# Patient Record
Sex: Female | Born: 1975 | Race: White | Hispanic: No | Marital: Married | Smoking: Former smoker
Health system: Southern US, Community
[De-identification: ages and names within clinical notes are randomized; demographics above are authoritative.]

## PROBLEM LIST (undated history)

## (undated) DIAGNOSIS — Z789 Other specified health status: Secondary | ICD-10-CM

## (undated) HISTORY — PX: TONSILLECTOMY: SUR1361

## (undated) HISTORY — PX: WISDOM TOOTH EXTRACTION: SHX21

---

## 2002-06-20 ENCOUNTER — Other Ambulatory Visit: Admission: RE | Admit: 2002-06-20 | Discharge: 2002-06-20 | Payer: Self-pay | Admitting: Obstetrics and Gynecology

## 2003-06-30 ENCOUNTER — Other Ambulatory Visit: Admission: RE | Admit: 2003-06-30 | Discharge: 2003-06-30 | Payer: Self-pay | Admitting: Obstetrics and Gynecology

## 2004-07-01 ENCOUNTER — Other Ambulatory Visit: Admission: RE | Admit: 2004-07-01 | Discharge: 2004-07-01 | Payer: Self-pay | Admitting: Obstetrics and Gynecology

## 2004-07-02 ENCOUNTER — Other Ambulatory Visit: Admission: RE | Admit: 2004-07-02 | Discharge: 2004-07-02 | Payer: Self-pay | Admitting: Obstetrics and Gynecology

## 2005-07-04 ENCOUNTER — Other Ambulatory Visit: Admission: RE | Admit: 2005-07-04 | Discharge: 2005-07-04 | Payer: Self-pay | Admitting: Obstetrics and Gynecology

## 2008-10-03 ENCOUNTER — Encounter: Admission: RE | Admit: 2008-10-03 | Discharge: 2008-10-03 | Payer: Self-pay | Admitting: Chiropractic Medicine

## 2013-05-06 LAB — OB RESULTS CONSOLE RPR: RPR: NONREACTIVE

## 2013-05-06 LAB — OB RESULTS CONSOLE HIV ANTIBODY (ROUTINE TESTING): HIV: NONREACTIVE

## 2013-05-06 LAB — OB RESULTS CONSOLE RUBELLA ANTIBODY, IGM: RUBELLA: IMMUNE

## 2013-05-06 LAB — OB RESULTS CONSOLE ABO/RH: RH TYPE: POSITIVE

## 2013-05-06 LAB — OB RESULTS CONSOLE HEPATITIS B SURFACE ANTIGEN: HEP B S AG: POSITIVE

## 2013-05-06 LAB — OB RESULTS CONSOLE ANTIBODY SCREEN: ANTIBODY SCREEN: NEGATIVE

## 2013-05-30 NOTE — L&D Delivery Note (Signed)
Delivery Note At 3:14 AM a viable female was delivered via Vaginal, Spontaneous Delivery (Presentation: Left Occiput Anterior).  APGAR: 9, 9; weight pending.   Placenta status: Intact, Spontaneous.  Cord:  with the following complications: body cord x 1, and left compound hand.    Anesthesia:  None, 1% lidocaine for repair  Episiotomy: none Lacerations: 2nd degree Suture Repair: 3-0 vicryl Est. Blood Loss (mL): 250cc  Mom to postpartum.  Baby to Couplet care / Skin to Skin.  Tawnya CrookHogan, Heather Donovan 11/26/2013, 3:37 AM  Dr. Tenny Crawoss was in the room immediatly after the birth of the infant, and assumes care of the patient.   Pt quickly progressed from 2 to 10 cm in MAU. Baby delivered by the midwife. I assumed care and the placenta delivered spontaneously, intact with 3V. A second degree laceration was repaired with 3-0 vicryl after the perineum was infiltrated with lidocaine. Mother and baby are doing well after delivery

## 2013-11-08 LAB — OB RESULTS CONSOLE GBS: STREP GROUP B AG: NEGATIVE

## 2013-11-26 ENCOUNTER — Inpatient Hospital Stay (HOSPITAL_COMMUNITY): Payer: 59

## 2013-11-26 ENCOUNTER — Encounter (HOSPITAL_COMMUNITY): Payer: Self-pay | Admitting: *Deleted

## 2013-11-26 ENCOUNTER — Inpatient Hospital Stay (HOSPITAL_COMMUNITY)
Admission: AD | Admit: 2013-11-26 | Discharge: 2013-11-28 | DRG: 775 | Disposition: A | Discharge status: Home / Self Care | Payer: 59 | Source: Ambulatory Visit | Attending: Obstetrics and Gynecology | Admitting: Obstetrics and Gynecology

## 2013-11-26 DIAGNOSIS — Z3490 Encounter for supervision of normal pregnancy, unspecified, unspecified trimester: Secondary | ICD-10-CM

## 2013-11-26 DIAGNOSIS — Z87891 Personal history of nicotine dependence: Secondary | ICD-10-CM

## 2013-11-26 DIAGNOSIS — O09519 Supervision of elderly primigravida, unspecified trimester: Secondary | ICD-10-CM | POA: Diagnosis present

## 2013-11-26 DIAGNOSIS — O328XX0 Maternal care for other malpresentation of fetus, not applicable or unspecified: Secondary | ICD-10-CM | POA: Diagnosis present

## 2013-11-26 HISTORY — DX: Other specified health status: Z78.9

## 2013-11-26 LAB — CBC
HCT: 36.6 % (ref 36.0–46.0)
Hemoglobin: 12.8 g/dL (ref 12.0–15.0)
MCH: 31.1 pg (ref 26.0–34.0)
MCHC: 35 g/dL (ref 30.0–36.0)
MCV: 89.1 fL (ref 78.0–100.0)
PLATELETS: 141 10*3/uL — AB (ref 150–400)
RBC: 4.11 MIL/uL (ref 3.87–5.11)
RDW: 13.7 % (ref 11.5–15.5)
WBC: 13.5 10*3/uL — ABNORMAL HIGH (ref 4.0–10.5)

## 2013-11-26 LAB — RPR

## 2013-11-26 MED ORDER — TETANUS-DIPHTH-ACELL PERTUSSIS 5-2.5-18.5 LF-MCG/0.5 IM SUSP
0.5000 mL | Freq: Once | INTRAMUSCULAR | Status: AC
  Administered 2013-11-27: 0.5 mL via INTRAMUSCULAR
  Filled 2013-11-26: qty 0.5

## 2013-11-26 MED ORDER — ONDANSETRON HCL 4 MG/2ML IJ SOLN: 4.0000 mg | Freq: Four times a day (QID) | INTRAMUSCULAR | Status: DC | PRN

## 2013-11-26 MED ORDER — DIPHENHYDRAMINE HCL 25 MG PO CAPS: 25.0000 mg | ORAL_CAPSULE | Freq: Four times a day (QID) | ORAL | Status: DC | PRN

## 2013-11-26 MED ORDER — OXYCODONE-ACETAMINOPHEN 5-325 MG PO TABS: 1.0000 | ORAL_TABLET | ORAL | Status: DC | PRN

## 2013-11-26 MED ORDER — FLEET ENEMA 7-19 GM/118ML RE ENEM: 1.0000 | ENEMA | RECTAL | Status: DC | PRN

## 2013-11-26 MED ORDER — DIBUCAINE 1 % RE OINT
1.0000 "application " | TOPICAL_OINTMENT | RECTAL | Status: DC | PRN
  Administered 2013-11-28: 1 via RECTAL
  Filled 2013-11-26: qty 28

## 2013-11-26 MED ORDER — OXYTOCIN 40 UNITS IN LACTATED RINGERS INFUSION - SIMPLE MED: 62.5000 mL/h | INTRAVENOUS | Status: DC

## 2013-11-26 MED ORDER — OXYTOCIN 10 UNIT/ML IJ SOLN
INTRAMUSCULAR | Status: AC
  Administered 2013-11-26: 10 [IU]
  Filled 2013-11-26: qty 1

## 2013-11-26 MED ORDER — ONDANSETRON HCL 4 MG PO TABS: 4.0000 mg | ORAL_TABLET | ORAL | Status: DC | PRN

## 2013-11-26 MED ORDER — LIDOCAINE HCL (PF) 1 % IJ SOLN
INTRAMUSCULAR | Status: AC
  Administered 2013-11-26: 30 mL
  Filled 2013-11-26: qty 30

## 2013-11-26 MED ORDER — OXYTOCIN BOLUS FROM INFUSION: 500.0000 mL | INTRAVENOUS | Status: DC

## 2013-11-26 MED ORDER — SENNOSIDES-DOCUSATE SODIUM 8.6-50 MG PO TABS
2.0000 | ORAL_TABLET | ORAL | Status: DC
  Administered 2013-11-27 – 2013-11-28 (×2): 2 via ORAL
  Filled 2013-11-26 (×2): qty 2

## 2013-11-26 MED ORDER — SIMETHICONE 80 MG PO CHEW: 80.0000 mg | CHEWABLE_TABLET | ORAL | Status: DC | PRN

## 2013-11-26 MED ORDER — LIDOCAINE HCL (PF) 1 % IJ SOLN
30.0000 mL | INTRAMUSCULAR | Status: DC | PRN
  Filled 2013-11-26: qty 30

## 2013-11-26 MED ORDER — IBUPROFEN 600 MG PO TABS
600.0000 mg | ORAL_TABLET | Freq: Four times a day (QID) | ORAL | Status: DC
  Administered 2013-11-26 – 2013-11-28 (×10): 600 mg via ORAL
  Filled 2013-11-26 (×10): qty 1

## 2013-11-26 MED ORDER — LANOLIN HYDROUS EX OINT: TOPICAL_OINTMENT | CUTANEOUS | Status: DC | PRN

## 2013-11-26 MED ORDER — ACETAMINOPHEN 325 MG PO TABS: 650.0000 mg | ORAL_TABLET | ORAL | Status: DC | PRN

## 2013-11-26 MED ORDER — BENZOCAINE-MENTHOL 20-0.5 % EX AERO
1.0000 "application " | INHALATION_SPRAY | CUTANEOUS | Status: DC | PRN
  Administered 2013-11-26: 1 via TOPICAL
  Filled 2013-11-26: qty 56

## 2013-11-26 MED ORDER — WITCH HAZEL-GLYCERIN EX PADS
1.0000 "application " | MEDICATED_PAD | CUTANEOUS | Status: DC | PRN
  Administered 2013-11-28: 1 via TOPICAL

## 2013-11-26 MED ORDER — LACTATED RINGERS IV SOLN: INTRAVENOUS | Status: DC

## 2013-11-26 MED ORDER — ZOLPIDEM TARTRATE 5 MG PO TABS: 5.0000 mg | ORAL_TABLET | Freq: Every evening | ORAL | Status: DC | PRN

## 2013-11-26 MED ORDER — CITRIC ACID-SODIUM CITRATE 334-500 MG/5ML PO SOLN: 30.0000 mL | ORAL | Status: DC | PRN

## 2013-11-26 MED ORDER — ONDANSETRON HCL 4 MG/2ML IJ SOLN: 4.0000 mg | INTRAMUSCULAR | Status: DC | PRN

## 2013-11-26 MED ORDER — IBUPROFEN 600 MG PO TABS: 600.0000 mg | ORAL_TABLET | Freq: Four times a day (QID) | ORAL | Status: DC | PRN

## 2013-11-26 MED ORDER — PRENATAL MULTIVITAMIN CH
1.0000 | ORAL_TABLET | Freq: Every day | ORAL | Status: DC
  Administered 2013-11-26 – 2013-11-28 (×3): 1 via ORAL
  Filled 2013-11-26 (×3): qty 1

## 2013-11-26 MED ORDER — LACTATED RINGERS IV SOLN: 500.0000 mL | INTRAVENOUS | Status: DC | PRN

## 2013-11-26 NOTE — MAU Note (Signed)
PT SAYS  SHE HURT BAD SINCE 930PM.  VE IN OFFICE  TODAY  1 CM.     DENIES HSV AND MRSA.   GBS-  NEG.

## 2013-11-26 NOTE — H&P (Signed)
Cynthia Cross is a 38 y.o. female presenting for labor  38 yo G1P0 @ 38+6 presents c/o painful contractions. Patient was 2 cm dilated and then rapidly progressed to complete. Pregnancy complicated by AMA. Pt had a false positive HepBSAg History OB History   Grav Para Term Preterm Abortions TAB SAB Ect Mult Living   1              Past Medical History  Diagnosis Date  . Medical history non-contributory    Past Surgical History  Procedure Laterality Date  . Tonsillectomy    . Wisdom tooth extraction     Family History: family history is not on file. Social History:  reports that she quit smoking about 8 months ago. She does not have any smokeless tobacco history on file. She reports that she does not drink alcohol or use illicit drugs.   Prenatal Transfer Tool  Maternal Diabetes: No Genetic Screening: Normal Maternal Ultrasounds/Referrals: Normal Fetal Ultrasounds or other Referrals:  None Maternal Substance Abuse:  Yes:  Type: Smoker Significant Maternal Medications:  None Significant Maternal Lab Results:  Lab values include: HBsAG positive, false positive Other Comments:  None  ROS: as above  Dilation: 10 Effacement (%): 100 Station: +2;+3 Exam by:: H. Hogan CNM Blood pressure 132/74, pulse 96, temperature 97.8 F (36.6 C), temperature source Oral, resp. rate 20, height 5\' 3"  (1.6 m), weight 81.818 kg (180 lb 6 oz). Exam Physical Exam  Prenatal labs: ABO, Rh: A/Positive/-- (12/08 0000) Antibody: Negative (12/08 0000) Rubella: Immune (12/08 0000) RPR: Nonreactive (12/08 0000)  HBsAg: Positive (12/08 0000)  HIV: Non-reactive (12/08 0000)  GBS: Negative (06/12 0000)   Assessment/Plan: 1) Admit    ROSS,KENDRA H. 11/26/2013, 3:43 AM

## 2013-11-26 NOTE — Lactation Note (Signed)
This note was copied from the chart of Girl Isella Goens. Lactation Consultation Note  Patient Name: Girl Gonzella LexChristina Adachi WUJWJ'XToday's Date: 11/26/2013 Reason for consult: Initial assessment Per mom the baby has been spitty   Maternal Data Formula Feeding for Exclusion: No Infant to breast within first hour of birth: Yes (permom fed 30 mins on both breast ) Has patient been taught Hand Expression?: Yes Does the patient have breastfeeding experience prior to this delivery?: No  Feeding Feeding Type: Breast Fed Length of feed: 20 min (multiply swallows )  LATCH Score/Interventions Latch: Grasps breast easily, tongue down, lips flanged, rhythmical sucking. Intervention(s): Adjust position;Assist with latch;Breast massage;Breast compression  Audible Swallowing: Spontaneous and intermittent  Type of Nipple: Everted at rest and after stimulation  Comfort (Breast/Nipple): Soft / non-tender     Hold (Positioning): Assistance needed to correctly position infant at breast and maintain latch. Intervention(s): Breastfeeding basics reviewed;Support Pillows;Position options;Skin to skin  LATCH Score: 9  Lactation Tools Discussed/Used Tools:  (per mom has a DEBP Ameda at home ) [facility]   Consult Status Consult Status: Follow-up Date: 11/27/13 Follow-up type: In-patient    Kathrin Greathouseorio, Matheau Orona Ann 11/26/2013, 3:53 PM

## 2013-11-26 NOTE — MAU Provider Note (Signed)
S: Cynthia Cross is a 38 y.o. G1P0 at 8558w6d who present today with contractions, LOF and vaginal bleeding.  O: VSS, afebrile Breathing through contractions appears very uncomfortable External: no lesion, large amount of blood tinged watery mucous Vagina: large amount of mucousy blood. Unable to see any pooling of fluid  Cervix: pink, smooth, 2cm by RN exam  Uterus: AGA  FHT: 130, minimal to moderate variability at times. Early decels Toco: CTX q 3 mins  A/P: unable to determine ROM status Will continue to monitor FHT and contraction pattern

## 2013-11-27 LAB — CBC
HCT: 32.7 % — ABNORMAL LOW (ref 36.0–46.0)
Hemoglobin: 11.1 g/dL — ABNORMAL LOW (ref 12.0–15.0)
MCH: 30.7 pg (ref 26.0–34.0)
MCHC: 33.9 g/dL (ref 30.0–36.0)
MCV: 90.6 fL (ref 78.0–100.0)
PLATELETS: 121 10*3/uL — AB (ref 150–400)
RBC: 3.61 MIL/uL — ABNORMAL LOW (ref 3.87–5.11)
RDW: 14.1 % (ref 11.5–15.5)
WBC: 10.5 10*3/uL (ref 4.0–10.5)

## 2013-11-27 NOTE — Progress Notes (Signed)
PPD#1 Pt without complaints. Baby spitting a lot VSSAF IMP/ stable Plan/ Routine care.

## 2013-11-28 NOTE — Lactation Note (Signed)
This note was copied from the chart of Cynthia Pheonix Marrin. Lactation Consultation Note Discussed weight loss and feedings w/mom. Mom states baby is starting to cluster feed now. Had been sleepy and not eating well, making up for loss time now. Starting to pee and poop again. Keeping a good feeding log, encouraged to wake for feedings if no feeding cues noted. Patient Name: Cynthia Gonzella LexChristina Torbert RUEAV'WToday's Date: 11/28/2013     Maternal Data    Feeding Feeding Type: Breast Fed  LATCH Score/Interventions                      Lactation Tools Discussed/Used     Consult Status      Cynthia Cross, Cynthia Cross 11/28/2013, 6:24 AM

## 2013-11-28 NOTE — Discharge Summary (Signed)
Obstetric Discharge Summary Reason for Admission: onset of labor Prenatal Procedures: none Intrapartum Procedures: spontaneous vaginal delivery Postpartum Procedures: none Complications-Operative and Postpartum: 2 degree perineal laceration Hemoglobin  Date Value Ref Range Status  11/27/2013 11.1* 12.0 - 15.0 g/dL Final     HCT  Date Value Ref Range Status  11/27/2013 32.7* 36.0 - 46.0 % Final   Discharge Diagnoses: Term Pregnancy-delivered  Discharge Information: Date: 11/28/2013 Activity: pelvic rest Diet: routine Medications: None and Ibuprofen Condition: stable Instructions: refer to practice specific booklet Discharge to: home Follow-up Information   Follow up with Cynthia Cross,Cynthia H., Cynthia Cross In 4 weeks.   Specialty:  Obstetrics and Gynecology   Contact information:   [address] [place] [address] [phone]       Newborn Data: Live born female  Birth Weight: 6 lb (2722 g) APGAR: 9, 9  Home with mother.  Cynthia Cross A 11/28/2013, 7:45 AM

## 2013-11-28 NOTE — Progress Notes (Signed)
Patient is eating, ambulating, voiding.  Pain control is good.  Filed Vitals:   11/26/13 1800 11/27/13 0535 11/27/13 1745 11/28/13 0618  BP: 135/80 118/76 132/79 122/66  Pulse: 92 78 91 89  Temp: 98 F (36.7 C) 98.4 F (36.9 C) 98.2 F (36.8 C) 98.4 F (36.9 C)  TempSrc: Oral Oral Oral Oral  Resp: 18 20 18 19   Height:      Weight:      SpO2:    97%    Fundus firm Perineum without swelling.  Lab Results  Component Value Date   WBC 10.5 11/27/2013   HGB 11.1* 11/27/2013   HCT 32.7* 11/27/2013   MCV 90.6 11/27/2013   PLT 121* 11/27/2013    A/Positive/-- (12/08 0000)/RI  A/P Post partum day 1 1/2.  Routine care.  Expect d/c routine.    Madisun Hargrove A

## 2013-12-02 ENCOUNTER — Ambulatory Visit (HOSPITAL_COMMUNITY)
Admission: RE | Admit: 2013-12-02 | Discharge: 2013-12-02 | Disposition: A | Discharge status: Home / Self Care | Payer: 59 | Source: Ambulatory Visit | Attending: Obstetrics and Gynecology | Admitting: Obstetrics and Gynecology

## 2013-12-02 NOTE — Lactation Note (Addendum)
Lactation Consult  Mother's reason for visit:  Dr. Chestine Sporelark recommended  Visit Type:  Feeding assessment and weight check  Appointment Notes: none , apt made this am , add on   Consult:  Initial Lactation Consultant:  Kathrin GreathouseIorio, Luka Stohr Ann Baby's information  - unable to populate into this chart , delivery record note signed  ________________________________________________________________________  Birth weight - 6-0 oz  D/C weight - 5-8 oz  1st Dr. Visit - 5-5 on 7/3  2nd Dr. Visit - 5-11 oz 7/5  Per mom - was recommended at the 7/3 Dr. Visit to supplement due to 11% weight loss,  ______________________________________________________________________  Mother's Name: Gonzella Lexhristina Fringer Type of delivery:  Vaginal delivery  Breastfeeding Experience: 1st baby  Maternal Medical Conditions:  none  Maternal Medications:  Per mom PNV , Advil , Stoll softener ,   ________________________________________________________________________  Breastfeeding History (Post Discharge)- per mom milk came in Friday to Saturday , some engorgement   Frequency of breastfeeding:  Every 2-4 hours  Duration of feeding:  10-20 mins   Supplementation- per mom was advised by Dr. Eddie Candleummings on Friday 7/3 due to 11% weight loss per mom                                  1-2 oz after every feeding , per mom found baby was spitting a large portion back up . So                                   She changed to 1 oz after feedings , she did better.  Total in 24 hours 2 oz   Formula:  See above note        Brand: Similac  Breastmilk:  Non as of yet   Method:  Bottle, per mom Tommie Tippy nipple , per mom  does well   Pumping  Type of pump:  Ameda Frequency:  x1 ( has not fed to the baby yet )  Volume:  30 ml   Infant Intake and Output Assessment  Voids: 6-8  in 24 hrs.  Color:  Clear yellow Stools:  5-6  in 24 hrs.  Color:  Yellow( per mom was light brown and recently is more yellow    ________________________________________________________________________  Maternal Breast Assessment  Breast:  Full Nipple:  Erect Pain level:  0 Pain interventions:  Expressed breast milk Instructed on the use comfort gels , mom has small scabs on both nipples . ( per mom with the latch initially  sore but improves , encouraged breast compressions with latch and to obtain depth) . Per mom at consult better.   _______________________________________________________________________ Feeding Assessment/Evaluation  Initial feeding assessment: Baby awake , alert and rooting, color pink , no jaundice , and well hydrated.  Infant's oral assessment:  Variance- noted baby to have a high palate , great tongue mobility and range motion   Positioning:  Football Left breast  LATCH documentation:  Latch:  2 = Grasps breast easily, tongue down, lips flanged, rhythmical sucking.  Audible swallowing:  2 = Spontaneous and intermittent  Type of nipple:  2 = Everted at rest and after stimulation  Comfort (Breast/Nipple):  2 = Soft / non-tender  Hold (Positioning):  2 = No assistance needed to correctly position infant at breast  LATCH score:  9-10 , ( LC assisted with breast compressions  and depth , multiply  Swallows and gulps noted   Attached assessment:  Deep with assist   Lips flanged:  Yes.    Lips untucked:  No.  Suck assessment:  Nutritive  Tools:  Comfort gels ( obtained at [facility] visit )  Instructed on use and cleaning of tool:  Yes.    Pre-feed weight:  2632 g  (5 lb. 12.9 oz.) Post-feed weight:  2664 g (5 lb. 14 oz.) Amount transferred:  32 ml Amount supplemented:  None   Additional Feeding Assessment -   Infant's oral assessment:  Variance , high palate noted , great tongue mobility   Positioning:  Cross cradle Left breast  LATCH documentation:  Latch:  2 = Grasps breast easily, tongue down, lips flanged, rhythmical sucking.  Audible swallowing:  2 = Spontaneous and  intermittent  Type of nipple:  2 = Everted at rest and after stimulation  Comfort (Breast/Nipple):  1 = Filling, red/small blisters or bruises, mild/mod discomfort  Hold (Positioning):  2 = No assistance needed to correctly position infant at breast  LATCH score:  9   Attached assessment:  Deep  Lips flanged:  Yes.    Lips untucked:  Yes.    Suck assessment:  Nutritive  Tools:  Comfort gels Instructed on use and cleaning of tool:  Yes.    Pre-feed weight:  2664g  (5 lb. 14 oz.) Post-feed weight:  2670 g (5 lb. 14.2 oz.) Amount transferred:  6 ml Amount supplemented:  None   Still hungry - switched to the right breast , football , with depth ( LC encouraging mom to use the breast compressions  with latch until the baby is in a consistent swallowing pattern and then intermittent )  Pre-feed weight - 2670 g ( 5Ib.14.2 oz )  Post - feed weight - 2710 g (5-15.6 oz)  Amount transferred :  40 ml   Total amount pumped post feed:  R 40  ml    L 38 ml  Total amount transferred:  78  ml Total supplement given:  None   Lactation Plan of Care - LC recommended to call [facility] and arrange for weight check this Thursday or Friday                                          - Per mom F/U weight with [place] [facility] next Tues. Om 7/14                                          - LC recommended weight check with [facility] Thursday or Friday ( 16 or 17 th )                                          - BFSG , the following Monday or Tuesday at [facility]                                          - Praised mom for her efforts breast feeding                                          -  Feedings - Continue every 2 1/2 - 3 hours and with feeding cues , cluster feeding is normal with growth spurts ( 7-10 days, 3 weeks , 6 weeks , 3 months , 6 months )                                          - Steps for latching - breast massage , hand express off milk if to full , ( save ) , latch skin to skin with breast  compressions until [facility] is in a consistent pattern and then intermittent                                          - Rotating positions helps milk supply                                          - Always soften 1st breast , and then offer 2nd breast , if Kylee doesn't latch 2nd breast release down with pumping , save milk ( pumping 10 mins )                                          - Comfort gels after feedings for the next 6 days . And EBM

## 2014-03-06 ENCOUNTER — Other Ambulatory Visit: Payer: Self-pay | Admitting: Internal Medicine

## 2014-03-06 DIAGNOSIS — R7989 Other specified abnormal findings of blood chemistry: Secondary | ICD-10-CM

## 2014-03-06 DIAGNOSIS — R945 Abnormal results of liver function studies: Secondary | ICD-10-CM

## 2014-03-14 ENCOUNTER — Ambulatory Visit
Admission: RE | Admit: 2014-03-14 | Discharge: 2014-03-14 | Disposition: A | Discharge status: Home / Self Care | Payer: 59 | Source: Ambulatory Visit | Attending: Internal Medicine | Admitting: Internal Medicine

## 2014-03-14 DIAGNOSIS — R945 Abnormal results of liver function studies: Secondary | ICD-10-CM

## 2014-03-14 DIAGNOSIS — R7989 Other specified abnormal findings of blood chemistry: Secondary | ICD-10-CM

## 2014-03-31 ENCOUNTER — Encounter (HOSPITAL_COMMUNITY): Payer: Self-pay | Admitting: *Deleted

## 2016-03-15 ENCOUNTER — Other Ambulatory Visit: Payer: Self-pay | Admitting: Obstetrics and Gynecology

## 2016-03-15 DIAGNOSIS — R928 Other abnormal and inconclusive findings on diagnostic imaging of breast: Secondary | ICD-10-CM

## 2016-03-21 ENCOUNTER — Ambulatory Visit
Admission: RE | Admit: 2016-03-21 | Discharge: 2016-03-21 | Disposition: A | Discharge status: Home / Self Care | Payer: 59 | Source: Ambulatory Visit | Attending: Obstetrics and Gynecology | Admitting: Obstetrics and Gynecology

## 2016-03-21 DIAGNOSIS — R928 Other abnormal and inconclusive findings on diagnostic imaging of breast: Secondary | ICD-10-CM

## 2016-11-07 IMAGING — MG 2D DIGITAL DIAGNOSTIC UNILATERAL RIGHT MAMMOGRAM WITH CAD AND AD
4 series · 4 of 12 positions shown · non-contrast
Comparison: Previous exam(s).

CLINICAL DATA: Recall from screening mammography

EXAM:
2D DIGITAL DIAGNOSTIC UNILATERAL RIGHT MAMMOGRAM WITH CAD AND
ADJUNCT TOMO

[R MLO]
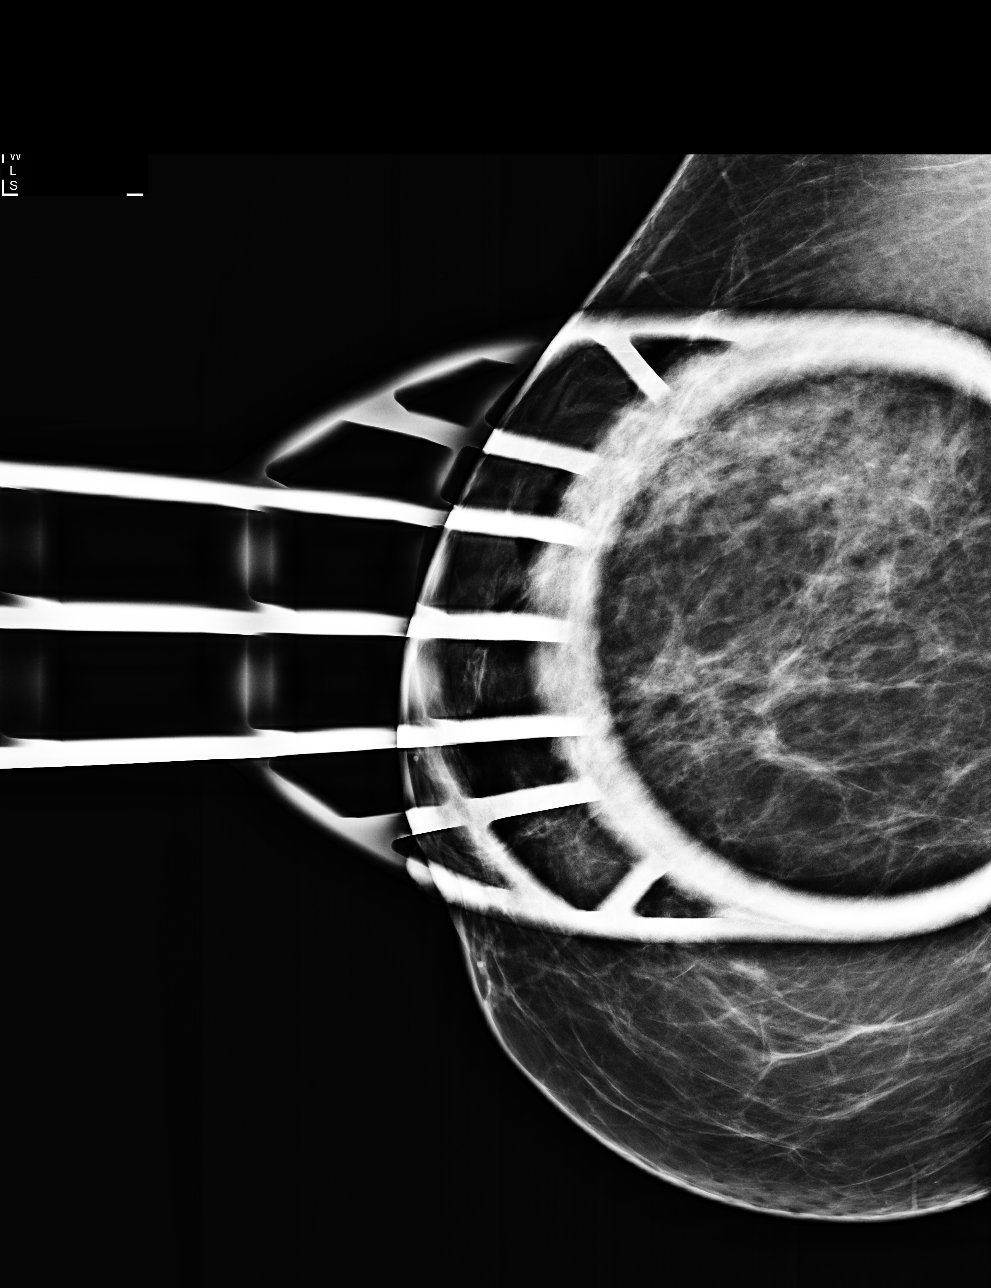

[R CC]
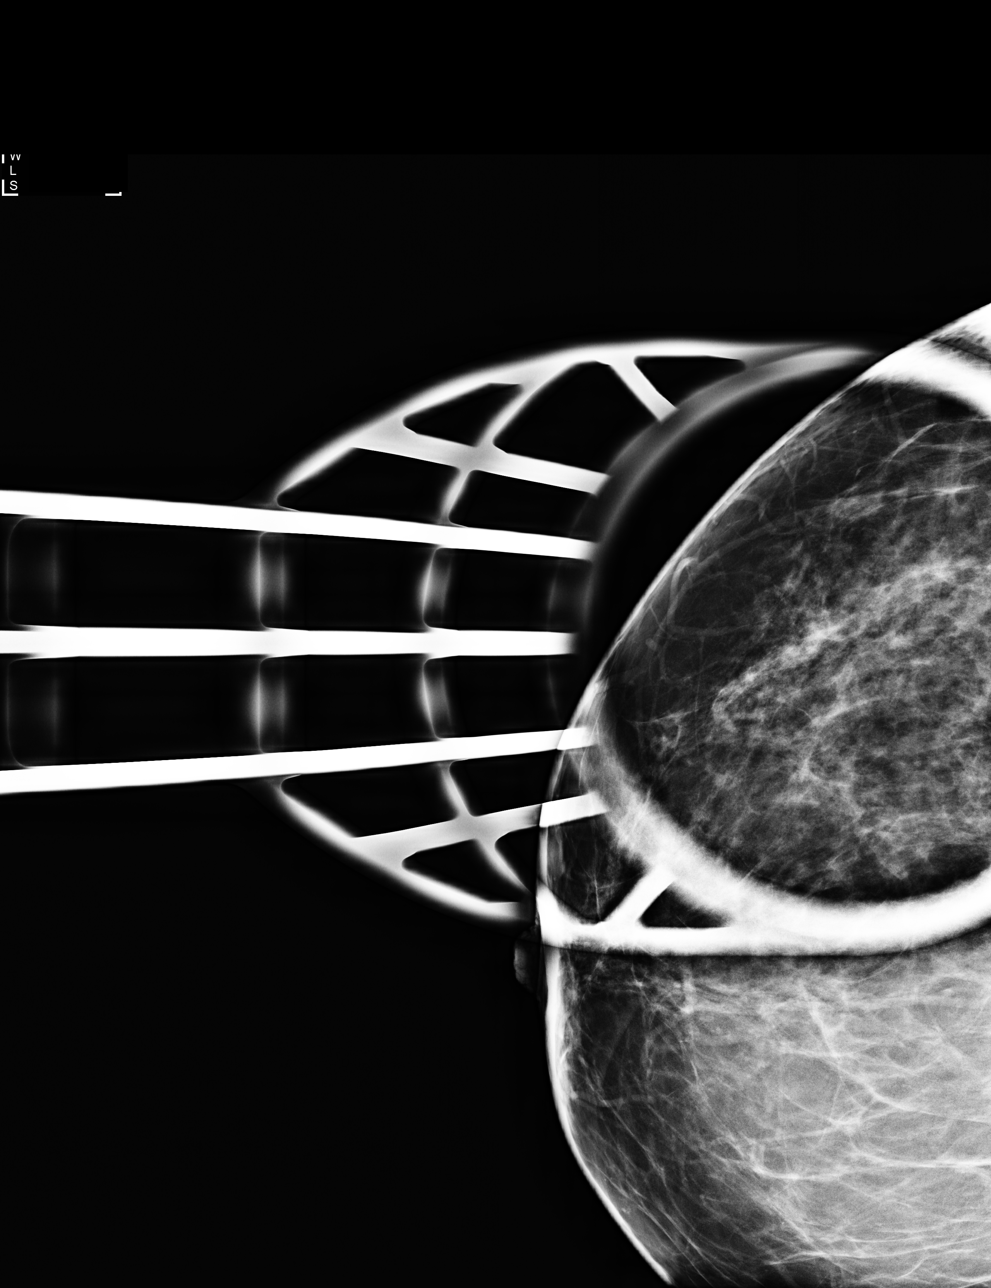

[R MLO tomo · tomo slice 39/78.0]
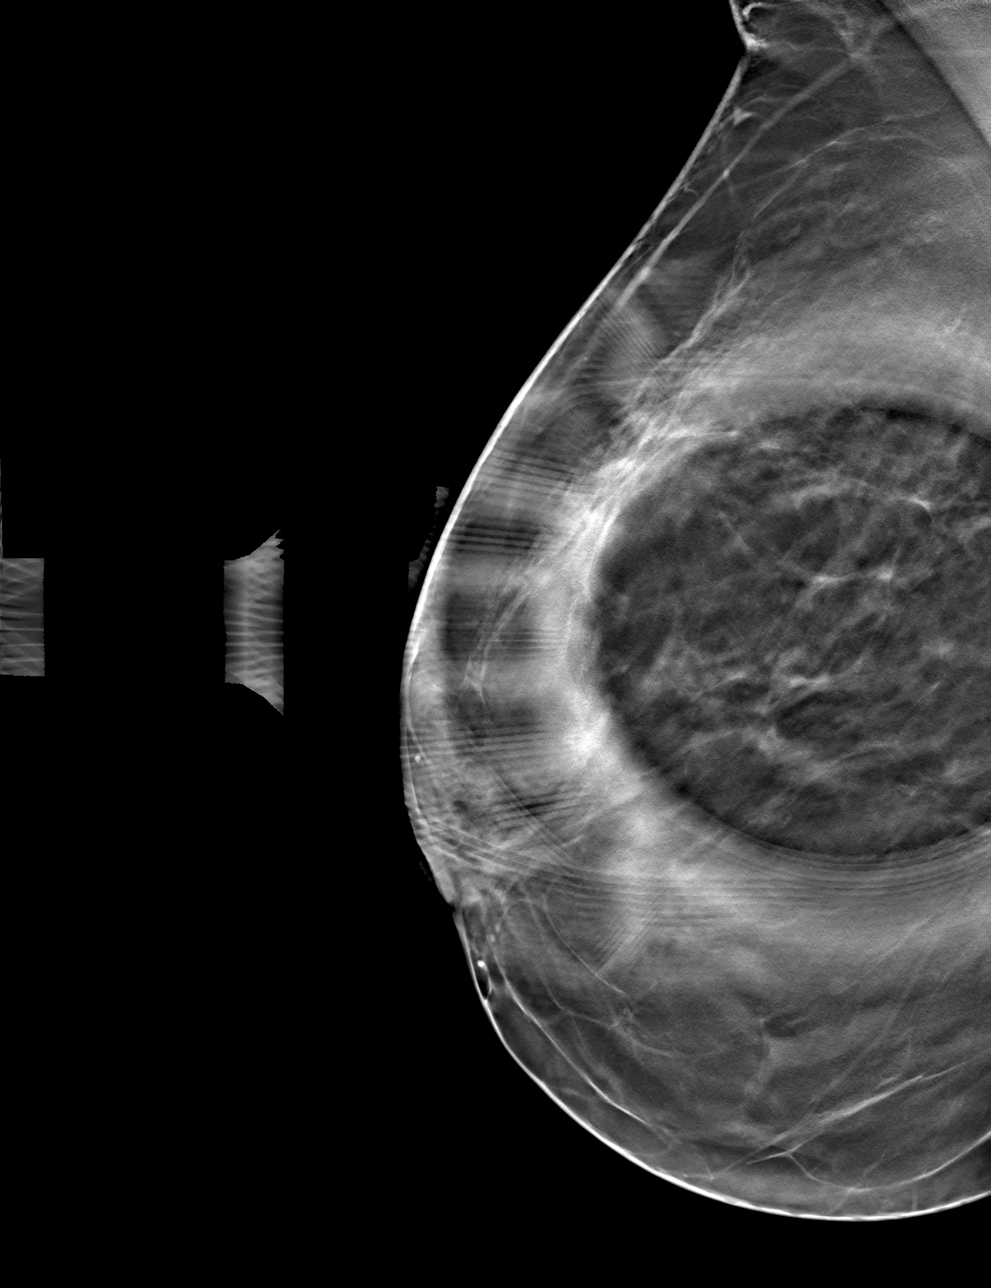

[R CC tomo · tomo slice 35/69.0]
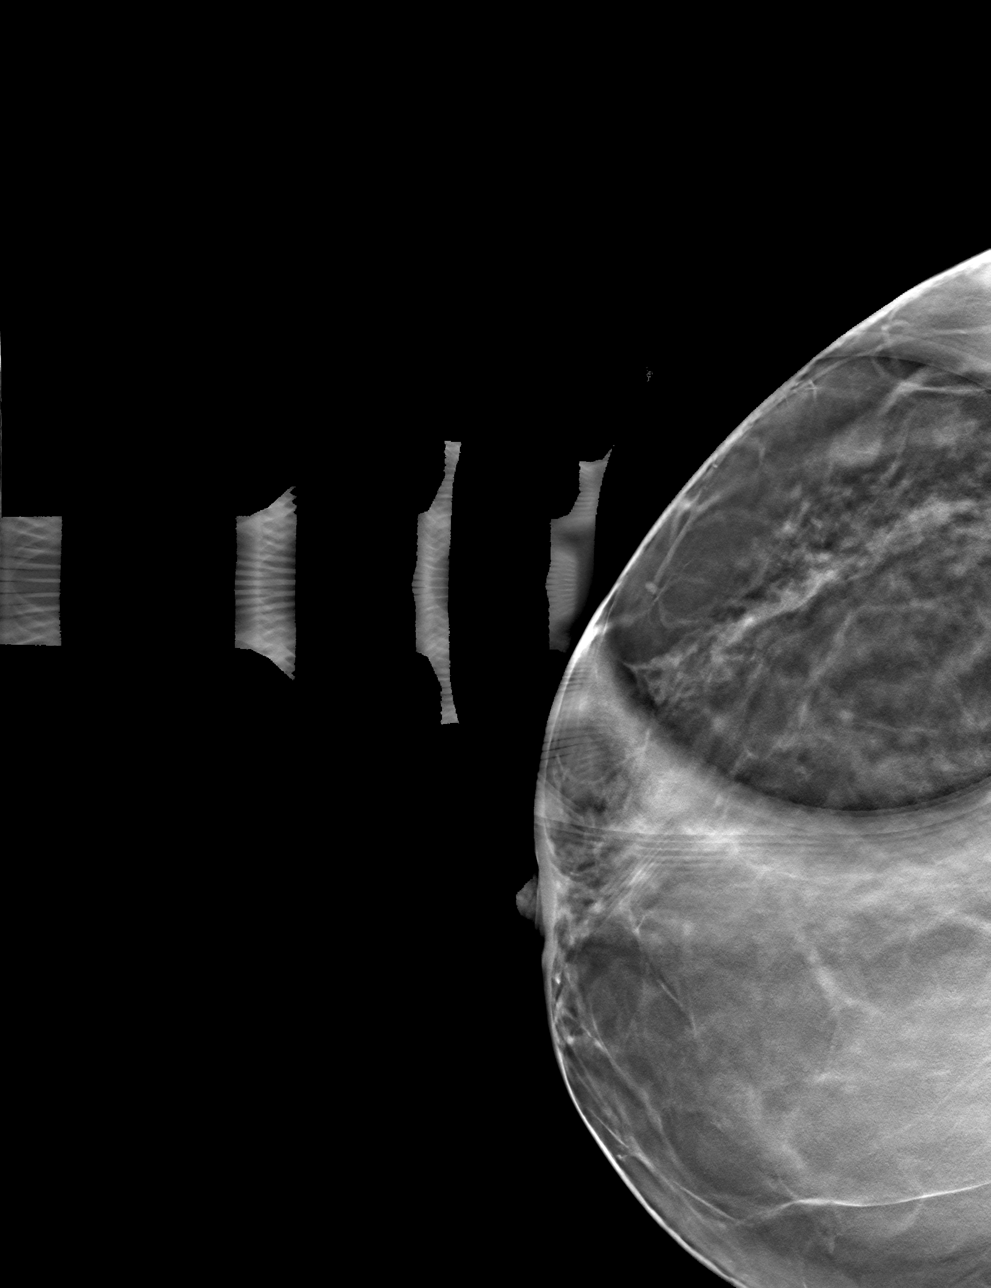

[4 of 12 positions shown; findings below may reference images not displayed]

ACR Breast Density Category b: There are scattered areas of
fibroglandular density.
FINDINGS: Additional views of the right breast demonstrate no persistent
abnormality. The appearance is consistent with a summation shadow.

Mammographic images were processed with CAD.
IMPRESSION: No persistent abnormality on additional evaluation of the right
breast.

RECOMMENDATION:
Screening mammography in 1 year.

I have discussed the findings and recommendations with the patient.
Results were also provided in writing at the conclusion of the
visit. If applicable, a reminder letter will be sent to the patient
regarding the next appointment.

BI-RADS CATEGORY  1: Negative.

## 2019-09-04 ENCOUNTER — Other Ambulatory Visit: Payer: Self-pay

## 2019-09-04 ENCOUNTER — Encounter: Payer: Self-pay | Admitting: Neurology

## 2019-09-04 ENCOUNTER — Ambulatory Visit: Payer: BC Managed Care – PPO | Admitting: Neurology

## 2019-09-04 VITALS — Temp 98.6°F | Ht 63.0 in | Wt 200.0 lb

## 2019-09-04 DIAGNOSIS — R002 Palpitations: Secondary | ICD-10-CM

## 2019-09-04 DIAGNOSIS — R42 Dizziness and giddiness: Secondary | ICD-10-CM | POA: Diagnosis not present

## 2019-09-04 DIAGNOSIS — E669 Obesity, unspecified: Secondary | ICD-10-CM

## 2019-09-04 DIAGNOSIS — G4719 Other hypersomnia: Secondary | ICD-10-CM | POA: Diagnosis not present

## 2019-09-04 DIAGNOSIS — H81399 Other peripheral vertigo, unspecified ear: Secondary | ICD-10-CM | POA: Diagnosis not present

## 2019-09-04 DIAGNOSIS — R351 Nocturia: Secondary | ICD-10-CM

## 2019-09-04 DIAGNOSIS — R0683 Snoring: Secondary | ICD-10-CM

## 2019-09-04 NOTE — Progress Notes (Signed)
Subjective:    Patient ID: Cynthia Cross is a 44 y.o. female.  HPI     Huston Foley, MD, PhD [facility] [address] [address] [place], [place] [address]  Dear Dr. Nicholos Johns,  I saw your patient, Cynthia Cross, upon your kind request in my neurologic clinic today for initial consultation of her lightheadedness and unsteadiness.  The patient is unaccompanied today.  As you know, Cynthia Cross is a 44 year old right-handed woman with an underlying benign medical history with the exception of obesity, who reports Intermittent fleeting spells of lightheadedness for the past 2 months.  These happens mostly in the mornings especially when she is in the shower but she has had a similar sensation even when sitting at the desk.  It helps to close her eyes briefly, sometimes it feels like she is swaying or there is a spinning sensation but typically it is not spinning and she has no associated headache, nausea or vomiting, no preceding illness.  She does not feel faint, she has not had any loss of consciousness or falls.  She tries to hydrate well with water.  She drinks 3 large bottles of water per day, and has reduced her caffeine intake.  She reports that she used to drink soda regularly but now drinks 1 every other day or so.  She denies any chest pain or shortness of breath but has had palpitations at night when laying still.  She has a history of snoring but denies any morning headaches.  She has nocturia about once per average night, goes to bed around 11 and rise time is around 630.  She quit smoking in 2014.  She has no family history of sleep apnea.  She has never had a sleep study.  Her Past Medical History Is Significant For: Past Medical History:  Diagnosis Date  . Medical history non-contributory     Her Past Surgical History Is Significant For: Past Surgical History:  Procedure Laterality Date  . TONSILLECTOMY    . WISDOM TOOTH EXTRACTION       Her Family History Is Significant For: History reviewed. No pertinent family history.  Her Social History Is Significant For: Social History   Socioeconomic History  . Marital status: Married    Spouse name: Not on file  . Number of children: Not on file  . Years of education: Not on file  . Highest education level: Not on file  Occupational History  . Not on file  Tobacco Use  . Smoking status: Former Smoker    Quit date: 02/26/2013    Years since quitting: 6.5  . Smokeless tobacco: Never Used  Substance and Sexual Activity  . Alcohol use: Yes    Comment: one time a yr.   . Drug use: No  . Sexual activity: Yes    Birth control/protection: None  Other Topics Concern  . Not on file  Social History Narrative  . Not on file   Social Determinants of Health   Financial Resource Strain:   . Difficulty of Paying Living Expenses:   Food Insecurity:   . Worried About Programme researcher, broadcasting/film/video in the Last Year:   . Barista in the Last Year:   Transportation Needs:   . Freight forwarder (Medical):   Marland Kitchen Lack of Transportation (Non-Medical):   Physical Activity:   . Days of Exercise per Week:   . Minutes of Exercise per Session:   Stress:   . Feeling  of Stress :   Social Connections:   . Frequency of Communication with Friends and Family:   . Frequency of Social Gatherings with Friends and Family:   . Attends Religious Services:   . Active Member of Clubs or Organizations:   . Attends Banker Meetings:   Marland Kitchen Marital Status:     Her Allergies Are:  No Known Allergies:   Her Current Medications Are:  Outpatient Encounter Medications as of 09/04/2019  Medication Sig  . [DISCONTINUED] Prenatal Vit-Fe Fumarate-FA (PRENATAL MULTIVITAMIN) TABS tablet Take 1 tablet by mouth daily at 12 noon.  . [DISCONTINUED] Progesterone Micronized (PROGESTERONE PO) Take by mouth.  . [DISCONTINUED] TRANEXAMIC ACID PO Take by mouth.   No facility-administered encounter  medications on file as of 09/04/2019.  :   Review of Systems:  Out of a complete 14 point review of systems, all are reviewed and negative with the exception of these symptoms as listed below:  Review of Systems  Neurological:       Pt presents today and she states that she has developed over the last 2 mths she began having light headed spells. They would last for brief periods and then it would pass. She has noticed in last 3 wks she has noticed that the light headed would linger longer. No imaging completed of the brain.     Objective:  Neurological Exam  Physical Exam Physical Examination:   Vitals:   09/04/19 1437  Temp: 98.6 F (37 C)   She reports slight lightheadedness when standing up but otherwise no vertigo symptoms, no nausea, only a fleeting sense of lightheadedness when she first stood up.  On orthostatic testing she has a supine blood pressure and pulse of 116/75 with a pulse of 87, sitting 123/82 with a pulse of 81, standing 131/85 with a pulse of 86.  General Examination: The patient is a very pleasant 44 y.o. female in no acute distress. She appears well-developed and adequately groomed.   HEENT: Normocephalic, atraumatic, pupils are equal, round and reactive to light and accommodation. Funduscopic exam is normal with sharp disc margins noted. Extraocular tracking is good without limitation to gaze excursion or nystagmus noted. Normal smooth pursuit is noted. Hearing is grossly intact. Tympanic membranes are clear bilaterally. Face is symmetric with normal facial animation and normal facial sensation. Speech is clear with no dysarthria noted. There is no hypophonia. There is no lip, neck/head, jaw or voice tremor. Neck is supple with full range of passive and active motion. There are no carotid bruits on auscultation. Oropharynx exam reveals: mild mouth dryness, adequate dental hygiene and mild airway crowding, due to smaller airway entry. Mallampati is class II. Tongue  protrudes centrally and palate elevates symmetrically. Tonsils are absent.   Chest: Clear to auscultation without wheezing, rhonchi or crackles noted.  Heart: S1+S2+0, regular and normal without murmurs, rubs or gallops noted.   Abdomen: Soft, non-tender and non-distended with normal bowel sounds appreciated on auscultation.  Extremities: There is no pitting edema in the distal lower extremities bilaterally.  Skin: Warm and dry without trophic changes noted.  Musculoskeletal: exam reveals no obvious joint deformities, tenderness or joint swelling or erythema.   Neurologically:  Mental status: The patient is awake, alert and oriented in all 4 spheres. Her immediate and remote memory, attention, language skills and fund of knowledge are appropriate. There is no evidence of aphasia, agnosia, apraxia or anomia. Speech is clear with normal prosody and enunciation. Thought process is linear. Mood is  normal and affect is normal.  Cranial nerves II - XII are as described above under HEENT exam. In addition: shoulder shrug is normal with equal shoulder height noted. Motor exam: Normal bulk, strength and tone is noted. There is no drift, tremor or rebound. Romberg is negative. Reflexes are 2+ throughout. Babinski: Toes are flexor bilaterally. Fine motor skills and coordination: intact with normal finger taps, normal hand movements, normal rapid alternating patting, normal foot taps and normal foot agility.  Cerebellar testing: No dysmetria or intention tremor on finger to nose testing. Heel to shin is unremarkable bilaterally. There is no truncal or gait ataxia.  Sensory exam: intact to light touch, vibration, temperature sense in the upper and lower extremities.  Gait, station and balance: She stands easily. No veering to one side is noted. No leaning to one side is noted. Posture is age-appropriate and stance is narrow based. Gait shows normal stride length and normal pace. No problems turning are noted.  Tandem walk is unremarkable.   Assessment and Plan:   In summary, Cynthia Cross is a very pleasant 44 y.o.-year old female with an underlying benign medical history with the exception of obesity, who presents for evaluation of intermittent lightheadedness with symptoms not telltale for vertigo, no obvious orthostatic hypotension or presyncopal symptoms, history also not suggestive of migraine etiology.  She may have some component of hypotension, neurological exam is nonfocal and reassuring.  She has some other symptoms including palpitations at night and history of snoring, given her obesity, she may be at risk for obstructive sleep apnea.  She does endorse some daytime tiredness.  She is advised to proceed with a sleep study.  We will also proceed with a brain MRI with and without contrast to rule out a structural cause of her symptoms from the neurological standpoint.  She may benefit from seeing a cardiologist what with her palpitations as well.  Nevertheless, she is encouraged to talk to you about it.  We will keep her posted as to her sleep study results and brain MRI results and take it from there.  We did talk about obstructive sleep apnea and the potential option of treating this with a CPAP or AutoPap machine.  I answered all her questions today and she was in agreement.  Thank you very much for allowing me to participate in the care of this nice patient. If I can be of any further assistance to you please do not hesitate to call me at [phone].  Sincerely,   Star Age, MD, PhD

## 2019-09-04 NOTE — Patient Instructions (Signed)
Your neurological exam is normal.  I do not have a good explanation for your episodic lightheadedness or dizziness.  Your history is not suggestive of migraines, vertigo, or syncope.  Please continue to hydrate well with water and change positions slowly.  You may benefit from seeing a cardiologist as you have had some palpitations before.  We will proceed with a brain MRI with and without contrast to rule out a structural cause of your symptoms and also a sleep study to rule out underlying obstructive sleep apnea as a potential cause of your dizziness.  We will call you to schedule these tests and also keep you updated by phone call and follow-up if needed.

## 2019-09-10 ENCOUNTER — Telehealth: Payer: Self-pay | Admitting: Neurology

## 2019-09-10 NOTE — Telephone Encounter (Signed)
spoke to the patient due to the cost she is going to think about it and get back to me  Cynthia Cross: [id number] (exp. 09/10/19 to 10/10/19)

## 2019-09-19 ENCOUNTER — Ambulatory Visit (INDEPENDENT_AMBULATORY_CARE_PROVIDER_SITE_OTHER): Payer: BC Managed Care – PPO | Admitting: Neurology

## 2019-09-19 ENCOUNTER — Other Ambulatory Visit: Payer: Self-pay

## 2019-09-19 DIAGNOSIS — R0683 Snoring: Secondary | ICD-10-CM

## 2019-09-19 DIAGNOSIS — G4719 Other hypersomnia: Secondary | ICD-10-CM

## 2019-09-19 DIAGNOSIS — G4710 Hypersomnia, unspecified: Secondary | ICD-10-CM

## 2019-09-19 DIAGNOSIS — R351 Nocturia: Secondary | ICD-10-CM

## 2019-09-19 DIAGNOSIS — E669 Obesity, unspecified: Secondary | ICD-10-CM

## 2019-09-19 DIAGNOSIS — G4720 Circadian rhythm sleep disorder, unspecified type: Secondary | ICD-10-CM

## 2019-09-19 DIAGNOSIS — H81399 Other peripheral vertigo, unspecified ear: Secondary | ICD-10-CM

## 2019-09-19 DIAGNOSIS — R002 Palpitations: Secondary | ICD-10-CM

## 2019-09-19 DIAGNOSIS — R42 Dizziness and giddiness: Secondary | ICD-10-CM

## 2019-09-27 NOTE — Progress Notes (Signed)
Patient referred by Dr. Nicholos Johns for lightheadedness, seen by me on 09/04/19 and she reported snoring, nighttime palpitations and nocturia. She had a diagnostic PSG on 09/19/19.   Please call and notify the patient that the recent sleep study did not show any significant obstructive sleep apnea with the exception of mild, intermittent snoring.  For her lightheadedness, we will proceed as planned with her brain MRI. Please ask her if she had been called to schedule yet.  Thanks,  Huston Foley, MD, PhD [facility])

## 2019-09-27 NOTE — Procedures (Signed)
PATIENT'S NAME:  Cynthia, Cross DOB:      June 23, 1975      MR#:    [id number]     DATE OF RECORDING: 09/19/2019 REFERRING M.D.:  Georgianne Fick MD Study Performed:   Baseline Polysomnogram HISTORY: 44 year old woman with a history of obesity, who reports intermittent fleeting spells of lightheadedness for the past 2 months. She has had palpitations at night when laying still. She has a history of snoring and reports nocturia. The patient's weight 200 pounds with a height of 63 (inches), resulting in a BMI of 35.5 kg/m2.   CURRENT MEDICATIONS: None listed   PROCEDURE:  This is a multichannel digital polysomnogram utilizing the Somnostar 11.2 system.  Electrodes and sensors were applied and monitored per AASM Specifications.   EEG, EOG, Chin and Limb EMG, were sampled at 200 Hz.  ECG, Snore and Nasal Pressure, Thermal Airflow, Respiratory Effort, CPAP Flow and Pressure, Oximetry was sampled at 50 Hz. Digital video and audio were recorded.      BASELINE STUDY  Lights Out was at 21:55 and Lights On at 04:32.  Total recording time (TRT) was 397 minutes, with a total sleep time (TST) of 304 minutes.   The patient's sleep latency was 57.5 minutes, which is delayed. REM latency was 102.5 minutes, which is normal.  The sleep efficiency was 76.6 %.     SLEEP ARCHITECTURE: WASO (Wake after sleep onset) was 45.5 minutes with mild sleep fragmentation noted. There were 6 minutes in Stage N1, 195.5 minutes Stage N2, 67.5 minutes Stage N3 and 35 minutes in Stage REM.  The percentage of Stage N1 was 2.%, Stage N2 was 64.3%, which is increased, Stage N3 was 22.2% and Stage R (REM sleep) was 11.5%, which is reduced. The arousals were noted as: 39 were spontaneous, 0 were associated with PLMs, 2 were associated with respiratory events.  RESPIRATORY ANALYSIS:  There were a total of 4 respiratory events:  0 obstructive apneas, 0 central apneas and 2 mixed apneas with a total of 2 apneas and an apnea index (AI) of  .4 /hour. There were 2 hypopneas with a hypopnea index of .4 /hour. The patient also had 0 respiratory event related arousals (RERAs).      The total APNEA/HYPOPNEA INDEX (AHI) was .8/hour and the total RESPIRATORY DISTURBANCE INDEX was  .8 /hour.  2 events occurred in REM sleep and 3 events in NREM. The REM AHI was  3.4 /hour, versus a non-REM AHI of .4. The patient spent 114.5 minutes of total sleep time in the supine position and 190 minutes in non-supine.. The supine AHI was 2.0 versus a non-supine AHI of 0.0.  OXYGEN SATURATION & C02:  The Wake baseline 02 saturation was 96%, with the lowest being 88%. Time spent below 89% saturation equaled 0 minutes.  PERIODIC LIMB MOVEMENTS: The patient had a total of 0 Periodic Limb Movements.  The Periodic Limb Movement (PLM) index was 0 and the PLM Arousal index was 0/hour.  Audio and video analysis did not show any abnormal or unusual movements, behaviors, phonations or vocalizations. The patient took 1 bathroom break. Mild intermittent snoring was noted. The EKG was in keeping with normal sinus rhythm (NSR).  Post-study, the patient indicated that sleep was worse than usual.   IMPRESSION:  1. Primary Snoring 2. Dysfunctions associated with sleep stages or arousal from sleep  RECOMMENDATIONS:  1. This study does not demonstrate any significant obstructive or central sleep disordered breathing with the exception of mild, intermittent snoring.  This study does not support an intrinsic sleep disorder as a cause of the patient's symptoms. Other causes, including circadian rhythm disturbances, an underlying mood disorder, medication effect and/or an underlying medical problem cannot be ruled out. 2. This study shows sleep fragmentation and abnormal sleep stage percentages; these are nonspecific findings and per se do not signify an intrinsic sleep disorder or a cause for the patient's sleep-related symptoms. Causes include (but are not limited to) the first  night effect of the sleep study, circadian rhythm disturbances, medication effect or an underlying mood disorder or medical problem.  3. The patient should be cautioned not to drive, work at heights, or operate dangerous or heavy equipment when tired or sleepy. Review and reiteration of good sleep hygiene measures should be pursued with any patient. 4. The patient her primary care physician will be notified of the test results.  I certify that I have reviewed the entire raw data recording prior to the issuance of this report in accordance with the Standards of Accreditation of the American Academy of Sleep Medicine (AASM)    Star Age, MD, PhD Diplomat, American Board of Neurology and Sleep Medicine (Neurology and Sleep Medicine)

## 2019-09-30 ENCOUNTER — Telehealth: Payer: Self-pay

## 2019-09-30 NOTE — Telephone Encounter (Signed)
I called pt. I advised pt that Dr. Frances Furbish reviewed pt's sleep study and found no osa in the recent sleep study. Dr. Frances Furbish recommends that pt proceed with the MRI of the brain. Pt has not been scheduled as of yet. Note from Gerome Apley on 09/10/2019 sts she called the pt to discuss scheduling but pt requested to hold off at that point due to finances.  I dicussed this with the pt again today and she agreed to pursing the MRI message sent to [facility] notifying her of this information.

## 2019-09-30 NOTE — Telephone Encounter (Signed)
-----   Message from Huston Foley, MD sent at 09/27/2019 12:40 PM EDT ----- Patient referred by Dr. Nicholos Johns for lightheadedness, seen by me on 09/04/19 and she reported snoring, nighttime palpitations and nocturia. She had a diagnostic PSG on 09/19/19.   Please call and notify the patient that the recent sleep study did not show any significant obstructive sleep apnea with the exception of mild, intermittent snoring.  For her lightheadedness, we will proceed as planned with her brain MRI. Please ask her if she had been called to schedule yet.  Thanks,  Huston Foley, MD, PhD [facility])

## 2019-09-30 NOTE — Telephone Encounter (Signed)
I spoke with the pt today in regards to her sleep study. Pt is agreeable to pursuing the MRI at this time.

## 2019-10-01 NOTE — Telephone Encounter (Signed)
Spoke to the patient she is scheduled at [facility] for 10/09/19. [employer] AUth: [id number] (exp. 09/10/19 to 11/01/19)

## 2019-10-09 ENCOUNTER — Ambulatory Visit: Payer: BC Managed Care – PPO

## 2019-10-09 ENCOUNTER — Other Ambulatory Visit: Payer: Self-pay

## 2019-10-09 DIAGNOSIS — R351 Nocturia: Secondary | ICD-10-CM

## 2019-10-09 DIAGNOSIS — G4719 Other hypersomnia: Secondary | ICD-10-CM

## 2019-10-09 DIAGNOSIS — E669 Obesity, unspecified: Secondary | ICD-10-CM

## 2019-10-09 DIAGNOSIS — R0683 Snoring: Secondary | ICD-10-CM

## 2019-10-09 DIAGNOSIS — R42 Dizziness and giddiness: Secondary | ICD-10-CM

## 2019-10-09 DIAGNOSIS — H81399 Other peripheral vertigo, unspecified ear: Secondary | ICD-10-CM

## 2019-10-09 DIAGNOSIS — R002 Palpitations: Secondary | ICD-10-CM

## 2019-10-09 MED ORDER — GADOBENATE DIMEGLUMINE 529 MG/ML IV SOLN
20.0000 mL | Freq: Once | INTRAVENOUS | Status: AC | PRN
  Administered 2019-10-09: 20 mL via INTRAVENOUS

## 2019-10-11 NOTE — Progress Notes (Signed)
Please call and advise the patient that the recent scan we did was within normal limits. We did a brain MRI w and wo contrast, which showed normal findings. In particular, there were no acute findings, such as a stroke, or mass or blood products. No further action is required on this test at this time. At this juncture, she can FU with PCP.

## 2019-10-15 ENCOUNTER — Telehealth: Payer: Self-pay

## 2019-10-15 NOTE — Telephone Encounter (Signed)
Pt notified of results and verbalized understanding. She was agreeable to following up with PCP.

## 2019-10-15 NOTE — Telephone Encounter (Signed)
-----   Message from Huston Foley, MD sent at 10/11/2019 10:01 AM EDT ----- Please call and advise the patient that the recent scan we did was within normal limits. We did a brain MRI w and wo contrast, which showed normal findings. In particular, there were no acute findings, such as a stroke, or mass or blood products. No further action is required on this test at this time. At this juncture, she can FU with PCP.

## 2019-11-27 ENCOUNTER — Ambulatory Visit: Payer: BC Managed Care – PPO | Admitting: Cardiovascular Disease

## 2019-11-27 ENCOUNTER — Encounter: Payer: Self-pay | Admitting: Cardiovascular Disease

## 2019-11-27 ENCOUNTER — Encounter: Payer: Self-pay | Admitting: *Deleted

## 2019-11-27 ENCOUNTER — Other Ambulatory Visit: Payer: Self-pay

## 2019-11-27 VITALS — BP 122/82 | HR 79 | Ht 64.0 in | Wt 197.0 lb

## 2019-11-27 DIAGNOSIS — R002 Palpitations: Secondary | ICD-10-CM | POA: Diagnosis not present

## 2019-11-27 DIAGNOSIS — R55 Syncope and collapse: Secondary | ICD-10-CM | POA: Diagnosis not present

## 2019-11-27 DIAGNOSIS — R42 Dizziness and giddiness: Secondary | ICD-10-CM | POA: Diagnosis not present

## 2019-11-27 HISTORY — DX: Dizziness and giddiness: R42

## 2019-11-27 HISTORY — DX: Palpitations: R00.2

## 2019-11-27 NOTE — Progress Notes (Signed)
Cardiology Office Note   Date:  11/27/2019   ID:  Cynthia Cross, DOB Jan 09, 1976, MRN [id number]  PCP:  Georgianne Fick, MD  Cardiologist:   Chilton Si, MD   No chief complaint on file.  History of Present Illness: Cynthia Cross is a 44 y.o. female who presents for an evaluation of dizziness.  Symptoms started a couple months ago.  There were a few episodes that lasted all day.  It occurs sometimes when she is sitting at her desk and seems to improve when closing her eyes.  She was tested for vertigo and symptoms were not thought to be consistent with this.  The episodes seem to happen a lot in the morning, especially when she is in the bathroom looking up and putting on make-up.  It is also happened in the shower.  It seems to occur sporadically.  There is no association with stress or her diet.  She denies any syncope.  She also denies any chest pain, lower extremity edema, or shortness of breath.  She has also experienced palpitations for years.  She notices them most when laying in bed at night.  It lasts around 10 seconds and improves with taking a deep breath.  The episodes occur randomly.  She was at the beach a few weeks ago and it occurred every night.  Other times it occurs once every week or 2.  She thinks it may sometimes be associated with stress.  When it happens it makes her feel like she needs to take a deep breath.  She occasionally has episodes of chest pressure that are unrelated and do not occur with exertion.  She had a sleep study that was negative for obstructive sleep apnea.  She is limited caffeine to 1 soda occasionally and this has not affected her symptoms.  Past Medical History:  Diagnosis Date  . Lightheadedness 11/27/2019  . Medical history non-contributory   . Palpitations 11/27/2019    Past Surgical History:  Procedure Laterality Date  . TONSILLECTOMY    . WISDOM TOOTH EXTRACTION       No current outpatient medications on file.   No  current facility-administered medications for this visit.    Allergies:   Patient has no known allergies.    Social History:  The patient  reports that she quit smoking about 6 years ago. She has never used smokeless tobacco. She reports current alcohol use. She reports that she does not use drugs.   Family History:  The patient's family history includes Aneurysm in her father; Heart attack (age of onset: 72) in her paternal grandmother; Hypertension in her paternal uncle; Kidney Stones in her mother.    ROS:  Please see the history of present illness.   Otherwise, review of systems are positive for none.   All other systems are reviewed and negative.    PHYSICAL EXAM: VS:  BP 122/82   Pulse 79   Ht 5\' 4"  (1.626 m)   Wt 197 lb (89.4 kg)   SpO2 98%   BMI 33.81 kg/m  , BMI Body mass index is 33.81 kg/m. GENERAL:  Well appearing HEENT:  Pupils equal round and reactive, fundi not visualized, oral mucosa unremarkable NECK:  No jugular venous distention, waveform within normal limits, carotid upstroke brisk and symmetric, no bruits LUNGS:  Clear to auscultation bilaterally HEART:  RRR.  PMI not displaced or sustained,S1 and S2 within normal limits, no S3, no S4, no clicks, no rubs, no murmurs ABD:  Flat,  positive bowel sounds normal in frequency in pitch, no bruits, no rebound, no guarding, no midline pulsatile mass, no hepatomegaly, no splenomegaly EXT:  2 plus pulses throughout, no edema, no cyanosis no clubbing SKIN:  No rashes no nodules NEURO:  Cranial nerves II through XII grossly intact, motor grossly intact throughout PSYCH:  Cognitively intact, oriented to person place and time   EKG:  EKG is ordered today. The ekg ordered today demonstrates sinus rhythm.  Rate 79 bpm.   Recent Labs: No results found for requested labs within last 8760 hours.    Lipid Panel No results found for: CHOL, TRIG, HDL, CHOLHDL, VLDL, LDLCALC, LDLDIRECT    Wt Readings from Last 3 Encounters:   11/27/19 197 lb (89.4 kg)  09/04/19 200 lb (90.7 kg)  11/26/13 180 lb 6 oz (81.8 kg)      ASSESSMENT AND PLAN:  # Lightheadedness: Symptoms do not seem to be consistent with a cardiovascular source.  The fact that it occurs when sitting is not consistent with orthostasis.  She also has been negative for orthostatic vital signs when previously checked.  I do wonder if she is having some vasovagal symptoms in the bathroom while in the shower.  Encouraged increasing her fluid intake.  Her blood pressure has been stable.  We will get an echo to make sure there is no evidence of any structural heart disease.  Have asked her to check her blood pressure at the time of 1 of these events to see if it is low.  # Palpitations: Episodes are sporadic and mostly when laying in bed.  I suspect she is having PACs or PVCs.  She had the appropriate lab testing with her PCP including thyroid.  These have been unremarkable.  We will get a 30 day monitor to better assess.  She is limiting caffeine intake.   Current medicines are reviewed at length with the patient today.  The patient does not have concerns regarding medicines.  The following changes have been made:  no change  Labs/ tests ordered today include:   Orders Placed This Encounter  Procedures  . CARDIAC EVENT MONITOR  . EKG 12-Lead  . ECHOCARDIOGRAM COMPLETE     Disposition:   FU with Flo Berroa C. Duke Salvia, MD, [facility] in 2 months     Signed, Ziquan Fidel C. Duke Salvia, MD, [facility]  11/27/2019 10:49 AM    [facility]

## 2019-11-27 NOTE — Progress Notes (Signed)
Patient ID: Cynthia Cross, female   DOB: [birth year], 43 y.o.   MRN: [id number] Patient enrolled for [employer] to ship a 30 day cardiac event monitor to her home.

## 2019-11-27 NOTE — Patient Instructions (Signed)
Medication Instructions:  Your physician recommends that you continue on your current medications as directed. Please refer to the Current Medication list given to you today.  *If you need a refill on your cardiac medications before your next appointment, please call your pharmacy*  Lab Work: NONE   Testing/Procedures: Your physician has requested that you have an echocardiogram. Echocardiography is a painless test that uses sound waves to create images of your heart. It provides your doctor with information about the size and shape of your heart and how well your heart's chambers and valves are working. This procedure takes approximately one hour. There are no restrictions for this procedure. [facility] AT [address]   Your physician has recommended that you wear an event monitor. Event monitors are medical devices that record the heart's electrical activity. Doctors most often [place] these monitors to diagnose arrhythmias. Arrhythmias are problems with the speed or rhythm of the heartbeat. The monitor is a small, portable device. You can wear one while you do your normal daily activities. This is usually used to diagnose what is causing palpitations/syncope (passing out). 30 DAY   Follow-Up: At [facility], you and your health needs are our priority.  As part of our continuing mission to provide you with exceptional heart care, we have created designated Provider Care Teams.  These Care Teams include your primary Cardiologist (physician) and Advanced Practice Providers (APPs -  Physician Assistants and Nurse Practitioners) who all work together to provide you with the care you need, when you need it.  We recommend signing up for the patient portal called "MyChart".  Sign up information is provided on this After Visit Summary.  MyChart is used to connect with patients for Virtual Visits (Telemedicine).  Patients are able to view lab/test results, encounter notes, upcoming  appointments, etc.  Non-urgent messages can be sent to your provider as well.   To learn more about what you can do with MyChart, go to [email or web address].    Your next appointment:   2 month(s)  The format for your next appointment:   In Person  Provider:   You may see DR [facility]  or one of the following Advanced Practice Providers on your designated Care Team:    Corine Shelter, PA-C  [place], [place]  Edd Fabian, [place]    Other Instructions  [employer] Cardiac Event Monitor Instructions Your physician has requested you wear your cardiac event monitor for __30__ days, (1-30). [employer] may call or text to confirm a shipping address. The monitor will be sent to a land address via UPS. [employer] will not ship a monitor to a PO BOX. It typically takes 3-5 days to receive your monitor after it has been enrolled. [employer] will assist with USPS tracking if your package is delayed. The telephone number for [employer] is [phone]. Once you have received your monitor, please review the enclosed instructions. Instruction tutorials can also be viewed under help and settings on the enclosed cell phone. Your monitor has already been registered assigning a specific monitor serial # to you.  Applying the monitor Remove cell phone from case and turn it on. The cell phone works as IT consultant and needs to be within [employer] of you at all times. The cell phone will need to be charged on a daily basis. We recommend you plug the cell phone into the enclosed charger at your bedside table every night.  Monitor batteries: You will receive two monitor batteries labelled #1  and #2. These are your recorders. Plug battery #2 onto the second connection on the enclosed charger. Keep one battery on the charger at all times. This will keep the monitor battery deactivated. It will also keep it fully charged for when you need to switch your monitor batteries. A small light will be  blinking on the battery emblem when it is charging. The light on the battery emblem will remain on when the battery is fully charged.  Open package of a Monitor strip. Insert battery #1 into black hood on strip and gently squeeze monitor battery onto connection as indicated in instruction booklet. Set aside while preparing skin.  Choose location for your strip, vertical or horizontal, as indicated in the instruction booklet. Shave to remove all hair from location. There cannot be any lotions, oils, powders, or colognes on skin where monitor is to be applied. Wipe skin clean with enclosed Saline wipe. Dry skin completely.  Peel paper labeled #1 off the back of the Monitor strip exposing the adhesive. Place the monitor on the chest in the vertical or horizontal position shown in the instruction booklet. One arrow on the monitor strip must be pointing upward. Carefully remove paper labeled #2, attaching remainder of strip to your skin. Try not to create any folds or wrinkles in the strip as you apply it.  Firmly press and release the circle in the center of the monitor battery. You will hear a small beep. This is turning the monitor battery on. The heart emblem on the monitor battery will light up every 5 seconds if the monitor battery in turned on and connected to the patient securely. Do not push and hold the circle down as this turns the monitor battery off. The cell phone will locate the monitor battery. A screen will appear on the cell phone checking the connection of your monitor strip. This may read poor connection initially but change to good connection within the next minute. Once your monitor accepts the connection you will hear a series of 3 beeps followed by a climbing crescendo of beeps. A screen will appear on the cell phone showing the two monitor strip placement options. Touch the picture that demonstrates where you applied the monitor strip.  Your monitor strip and battery are  waterproof. You are able to shower, bathe, or swim with the monitor on. They just ask you do not submerge deeper than 3 feet underwater. We recommend removing the monitor if you are swimming in a lake, river, or ocean.  Your monitor battery will need to be switched to a fully charged monitor battery approximately once a week. The cell phone will alert you of an action which needs to be made.  On the cell phone, tap for details to reveal connection status, monitor battery status, and cell phone battery status. The green dots indicates your monitor is in good status. A red dot indicates there is something that needs your attention.  To record a symptom, click the circle on the monitor battery. In 30-60 seconds a list of symptoms will appear on the cell phone. Select your symptom and tap save. Your monitor will record a sustained or significant arrhythmia regardless of you clicking the button. Some patients do not feel the heart rhythm irregularities. [employer] will notify us of any serious or critical events.  Refer to instruction booklet for instructions on switching batteries, changing strips, the Do not disturb or Pause features, or any additional questions.  Call [employer] at [phone], to confirm your  monitor is transmitting and record your baseline. They will answer any questions you may have regarding the monitor instructions at that time.  Returning the monitor to [employer] Place all equipment back into blue box. Peel off strip of paper to expose adhesive and close box securely. There is a prepaid UPS shipping label on this box. Drop in a UPS drop box, or at a UPS facility like [employer]. You may also contact [employer] to arrange UPS to pick up monitor package at your home.   Echocardiogram An echocardiogram is a procedure that uses painless sound waves (ultrasound) to produce an image of the heart. Images from an echocardiogram can provide important information  about:  Signs of coronary artery disease (CAD).  Aneurysm detection. An aneurysm is a weak or damaged part of an artery wall that bulges out from the normal force of blood pumping through the body.  Heart size and shape. Changes in the size or shape of the heart can be associated with certain conditions, including heart failure, aneurysm, and CAD.  Heart muscle function.  Heart valve function.  Signs of a past heart attack.  Fluid buildup around the heart.  Thickening of the heart muscle.  A tumor or infectious growth around the heart valves. Tell a health care provider about:  Any allergies you have.  All medicines you are taking, including vitamins, herbs, eye drops, creams, and over-the-counter medicines.  Any blood disorders you have.  Any surgeries you have had.  Any medical conditions you have.  Whether you are pregnant or may be pregnant. What are the risks? Generally, this is a safe procedure. However, problems may occur, including:  Allergic reaction to dye (contrast) that may be used during the procedure. What happens before the procedure? No specific preparation is needed. You may eat and drink normally. What happens during the procedure?   An IV tube may be inserted into one of your veins.  You may receive contrast through this tube. A contrast is an injection that improves the quality of the pictures from your heart.  A gel will be applied to your chest.  A wand-like tool (transducer) will be moved over your chest. The gel will help to transmit the sound waves from the transducer.  The sound waves will harmlessly bounce off of your heart to allow the heart images to be captured in real-time motion. The images will be recorded on a computer. The procedure may vary among health care providers and hospitals. What happens after the procedure?  You may return to your normal, everyday life, including diet, activities, and medicines, unless your health care  provider tells you not to do that. Summary  An echocardiogram is a procedure that uses painless sound waves (ultrasound) to produce an image of the heart.  Images from an echocardiogram can provide important information about the size and shape of your heart, heart muscle function, heart valve function, and fluid buildup around your heart.  You do not need to do anything to prepare before this procedure. You may eat and drink normally.  After the echocardiogram is completed, you may return to your normal, everyday life, unless your health care provider tells you not to do that. This information is not intended to replace advice given to you by your health care provider. Make sure you discuss any questions you have with your health care provider. Document Revised: 09/06/2018 Document Reviewed: 06/18/2016 Elsevier Patient Education  2020 [employer].

## 2019-12-08 ENCOUNTER — Encounter (INDEPENDENT_AMBULATORY_CARE_PROVIDER_SITE_OTHER): Payer: BC Managed Care – PPO

## 2019-12-08 DIAGNOSIS — R002 Palpitations: Secondary | ICD-10-CM

## 2019-12-08 DIAGNOSIS — R55 Syncope and collapse: Secondary | ICD-10-CM

## 2019-12-19 ENCOUNTER — Ambulatory Visit (HOSPITAL_COMMUNITY): Payer: BC Managed Care – PPO | Attending: Cardiology

## 2019-12-19 ENCOUNTER — Other Ambulatory Visit: Payer: Self-pay

## 2019-12-19 DIAGNOSIS — R55 Syncope and collapse: Secondary | ICD-10-CM

## 2019-12-19 DIAGNOSIS — R002 Palpitations: Secondary | ICD-10-CM | POA: Diagnosis present

## 2019-12-19 LAB — ECHOCARDIOGRAM COMPLETE
Area-P 1/2: 3.66 cm2
S' Lateral: 3.2 cm

## 2020-01-23 ENCOUNTER — Encounter: Payer: Self-pay | Admitting: Cardiovascular Disease

## 2020-01-23 ENCOUNTER — Telehealth (HOSPITAL_COMMUNITY): Payer: Self-pay

## 2020-01-23 ENCOUNTER — Ambulatory Visit (INDEPENDENT_AMBULATORY_CARE_PROVIDER_SITE_OTHER): Payer: BC Managed Care – PPO | Admitting: Cardiovascular Disease

## 2020-01-23 ENCOUNTER — Other Ambulatory Visit: Payer: Self-pay

## 2020-01-23 VITALS — BP 122/72 | HR 83 | Ht 63.0 in | Wt 201.0 lb

## 2020-01-23 DIAGNOSIS — I4729 Other ventricular tachycardia: Secondary | ICD-10-CM

## 2020-01-23 DIAGNOSIS — I472 Ventricular tachycardia: Secondary | ICD-10-CM

## 2020-01-23 DIAGNOSIS — R002 Palpitations: Secondary | ICD-10-CM | POA: Diagnosis not present

## 2020-01-23 DIAGNOSIS — I493 Ventricular premature depolarization: Secondary | ICD-10-CM | POA: Diagnosis not present

## 2020-01-23 DIAGNOSIS — R42 Dizziness and giddiness: Secondary | ICD-10-CM | POA: Diagnosis not present

## 2020-01-23 HISTORY — DX: Other ventricular tachycardia: I47.29

## 2020-01-23 HISTORY — DX: Ventricular tachycardia: I47.2

## 2020-01-23 HISTORY — DX: Ventricular premature depolarization: I49.3

## 2020-01-23 MED ORDER — METOPROLOL SUCCINATE ER 25 MG PO TB24: 25.0000 mg | ORAL_TABLET | Freq: Every day | ORAL | 3 refills | Status: DC

## 2020-01-23 NOTE — Progress Notes (Signed)
Cardiology Office Note   Date:  01/23/2020   ID:  Cynthia Cross, DOB 1975-11-15, MRN [id number]  PCP:  Georgianne Fick, MD  Cardiologist:   Chilton Si, MD   No chief complaint on file.  History of Present Illness: Cynthia Cross is a 44 y.o. female with PACs, PVCs, and NSVT who presents for follow-up.  She was initially seen 10/2019 for an evaluation of dizziness.  Symptoms started a couple months prior.  There were a few episodes that lasted all day.  It occurs sometimes when she is sitting at her desk and seems to improve when closing her eyes.  She was tested for vertigo and symptoms were not thought to be consistent with this.  The episodes seem to happen a lot in the morning, especially when she is in the bathroom looking up and putting on make-up.  It is also happened in the shower.  She had an echo 11/2019 that revealed LVEF 55 to 60% with normal diastolic function.  It was otherwise unremarkable.  She wore a 30-day event monitor that revealed PACs, PVCs, and short runs of NSVT.  She presents today stating that she is had a couple more episodes since she took the monitor off.  She has been trying to walk for exercise when she can but her time is limited.  She has no exertional chest pain or shortness of breath.  She denies lower extremity edema, orthopnea, or PND.  She continues to try to limit caffeine intake.   Past Medical History:  Diagnosis Date  . Lightheadedness 11/27/2019  . Medical history non-contributory   . NSVT (nonsustained ventricular tachycardia) (HCC) 01/23/2020  . Palpitations 11/27/2019  . PVC (premature ventricular contraction) 01/23/2020    Past Surgical History:  Procedure Laterality Date  . TONSILLECTOMY    . WISDOM TOOTH EXTRACTION       Current Outpatient Medications  Medication Sig Dispense Refill  . metoprolol succinate (TOPROL XL) 25 MG 24 hr tablet Take 1 tablet (25 mg total) by mouth daily. 90 tablet 3   No current  facility-administered medications for this visit.    Allergies:   Patient has no known allergies.    Social History:  The patient  reports that she quit smoking about 6 years ago. She has never used smokeless tobacco. She reports current alcohol use. She reports that she does not use drugs.   Family History:  The patient's family history includes Aneurysm in her father; Heart attack (age of onset: 44) in her paternal grandmother; Hypertension in her paternal uncle; Kidney Stones in her mother.    ROS:  Please see the history of present illness.   Otherwise, review of systems are positive for none.   All other systems are reviewed and negative.    PHYSICAL EXAM: VS:  BP 122/72   Pulse 83   Ht 5\' 3"  (1.6 m)   Wt 201 lb (91.2 kg)   SpO2 98%   BMI 35.61 kg/m  , BMI Body mass index is 35.61 kg/m. GENERAL:  Well appearing HEENT:  Pupils equal round and reactive, fundi not visualized, oral mucosa unremarkable NECK:  No jugular venous distention, waveform within normal limits, carotid upstroke brisk and symmetric, no bruits LUNGS:  Clear to auscultation bilaterally HEART:  RRR.  PMI not displaced or sustained,S1 and S2 within normal limits, no S3, no S4, no clicks, no rubs, no murmurs ABD:  Flat, positive bowel sounds normal in frequency in pitch, no bruits, no rebound, no  guarding, no midline pulsatile mass, no hepatomegaly, no splenomegaly EXT:  2 plus pulses throughout, no edema, no cyanosis no clubbing SKIN:  No rashes no nodules NEURO:  Cranial nerves II through XII grossly intact, motor grossly intact throughout PSYCH:  Cognitively intact, oriented to person place and time   EKG:  EKG is not ordered today. The ekg ordered 11/27/19 demonstrates sinus rhythm.  Rate 79 bpm.  30 Day Event Monitor 01/10/20:  Quality: Fair.  Baseline artifact. Predominant rhythm: sinus rhythm Average heart rate: 86 bpm Max heart rate: 157 bpm Min heart rate: 57 bpm Pauses >2.5 seconds:  none  PACs and PVCs noted 4 beats NSVT   Echo 12/19/19: 1. Left ventricular ejection fraction, by estimation, is 55 to 60%. The  left ventricle has normal function. The left ventricle has no regional  wall motion abnormalities. Left ventricular diastolic parameters were  normal.  2. Right ventricular systolic function is normal. The right ventricular  size is normal. There is normal pulmonary artery systolic pressure. The  estimated right ventricular systolic pressure is 19.0 mmHg.  3. The mitral valve is normal in structure. Trivial mitral valve  regurgitation. No evidence of mitral stenosis.  4. The aortic valve is normal in structure. Aortic valve regurgitation is  not visualized. No aortic stenosis is present.  5. The inferior vena cava is normal in size with greater than 50%  respiratory variability, suggesting right atrial pressure of 3 mmHg.   Recent Labs: No results found for requested labs within last 8760 hours.    Lipid Panel No results found for: CHOL, TRIG, HDL, CHOLHDL, VLDL, LDLCALC, LDLDIRECT    Wt Readings from Last 3 Encounters:  01/23/20 201 lb (91.2 kg)  11/27/19 197 lb (89.4 kg)  09/04/19 200 lb (90.7 kg)      ASSESSMENT AND PLAN:  # PACs: # PVCs: # NSVT:   Episodes were noted on the monitor and she was symptomatic.  Thyroid function, blood counts, and electrolytes are unremarkable.  Her heart is structurally normal.  We will start metoprolol succinate 25 mg daily.  She does not have symptoms of ischemia.  However given the NSVT we will get an exercise Myoview to assess for ischemia.   Current medicines are reviewed at length with the patient today.  The patient does not have concerns regarding medicines.  The following changes have been made:  no change  Labs/ tests ordered today include:   Orders Placed This Encounter  Procedures  . MYOCARDIAL PERFUSION IMAGING     Disposition:   FU with Kaavya Puskarich C. Duke Salvia, MD, [facility] in 3 months      Signed, Nunzio Banet C. Duke Salvia, MD, [facility]  01/23/2020 12:50 PM    [facility]

## 2020-01-23 NOTE — Patient Instructions (Addendum)
Medication Instructions:  START METOPROLOL SUC 25 MG DAILY   *If you need a refill on your cardiac medications before your next appointment, please call your pharmacy*  Lab Work: YOU WILL NEED A COVID SCREENING TEST 3 DAYS PRIOR TO YOUR STRESS TEST.   Testing/Procedures: Your physician has requested that you have en exercise stress myoview. For further information please visit [email or web address] Please follow instruction sheet, as given [facility] AT [address]  NO METOPROLOL MORNING OF YOU TEST   Follow-Up: At [facility], you and your health needs are our priority.  As part of our continuing mission to provide you with exceptional heart care, we have created designated Provider Care Teams.  These Care Teams include your primary Cardiologist (physician) and Advanced Practice Providers (APPs -  Physician Assistants and Nurse Practitioners) who all work together to provide you with the care you need, when you need it.  We recommend signing up for the patient portal called "MyChart".  Sign up information is provided on this After Visit Summary.  MyChart is used to connect with patients for Virtual Visits (Telemedicine).  Patients are able to view lab/test results, encounter notes, upcoming appointments, etc.  Non-urgent messages can be sent to your provider as well.   To learn more about what you can do with MyChart, go to [email or web address].    Your next appointment:   3 month(s)  The format for your next appointment:   In Person  Provider:   You may see Chilton Si, MD or one of the following Advanced Practice Providers on your designated Care Team:    Corine Shelter, PA-C  [place], [place]  Edd Fabian, [place]   Other Instructions  Cardiac Nuclear Scan A cardiac nuclear scan is a test that is done to check the flow of blood to your heart. It is done when you are resting and when you are exercising. The test looks for problems such as:  Not  enough blood reaching a portion of the heart.  The heart muscle not working as it should. You may need this test if:  You have heart disease.  You have had lab results that are not normal.  You have had heart surgery or a balloon procedure to open up blocked arteries (angioplasty).  You have chest pain.  You have shortness of breath. In this test, a special dye (tracer) is put into your bloodstream. The tracer will travel to your heart. A camera will then take pictures of your heart to see how the tracer moves through your heart. This test is usually done at a hospital and takes 2-4 hours. Tell a doctor about:  Any allergies you have.  All medicines you are taking, including vitamins, herbs, eye drops, creams, and over-the-counter medicines.  Any problems you or family members have had with anesthetic medicines.  Any blood disorders you have.  Any surgeries you have had.  Any medical conditions you have.  Whether you are pregnant or may be pregnant. What are the risks? Generally, this is a safe test. However, problems may occur, such as:  Serious chest pain and heart attack. This is only a risk if the stress portion of the test is done.  Rapid heartbeat.  A feeling of warmth in your chest. This feeling usually does not last long.  Allergic reaction to the tracer. What happens before the test?  Ask your doctor about changing or stopping your normal medicines. This is important.  Follow instructions from  your doctor about what you cannot eat or drink.  Remove your jewelry on the day of the test. What happens during the test?  An IV tube will be inserted into one of your veins.  Your doctor will give you a small amount of tracer through the IV tube.  You will wait for 20-40 minutes while the tracer moves through your bloodstream.  Your heart will be monitored with an electrocardiogram (ECG).  You will lie down on an exam table.  Pictures of your heart will be  taken for about 15-20 minutes.  You may also have a stress test. For this test, one of these things may be done: ? You will be asked to exercise on a treadmill or a stationary bike. ? You will be given medicines that will make your heart work harder. This is done if you are unable to exercise.  When blood flow to your heart has peaked, a tracer will again be given through the IV tube.  After 20-40 minutes, you will get back on the exam table. More pictures will be taken of your heart.  Depending on the tracer that is used, more pictures may need to be taken 3-4 hours later.  Your IV tube will be removed when the test is over. The test may vary among doctors and hospitals. What happens after the test?  Ask your doctor: ? Whether you can return to your normal schedule, including diet, activities, and medicines. ? Whether you should drink more fluids. This will help to remove the tracer from your body. Drink enough fluid to keep your pee (urine) pale yellow.  Ask your doctor, or the department that is doing the test: ? When will my results be ready? ? How will I get my results? Summary  A cardiac nuclear scan is a test that is done to check the flow of blood to your heart.  Tell your doctor whether you are pregnant or may be pregnant.  Before the test, ask your doctor about changing or stopping your normal medicines. This is important.  Ask your doctor whether you can return to your normal activities. You may be asked to drink more fluids. This information is not intended to replace advice given to you by your health care provider. Make sure you discuss any questions you have with your health care provider. Document Revised: 09/05/2018 Document Reviewed: 10/30/2017 Elsevier Patient Education  2020 [employer].

## 2020-01-23 NOTE — Telephone Encounter (Signed)
Spoke with the patient, detailed instructions given. She stated that she understood and would be here for her test. Asked to call back with any questions. Cynthia Cross EMTP 

## 2020-01-25 ENCOUNTER — Other Ambulatory Visit (HOSPITAL_COMMUNITY)
Admission: RE | Admit: 2020-01-25 | Discharge: 2020-01-25 | Disposition: A | Discharge status: Home / Self Care | Payer: BC Managed Care – PPO | Source: Ambulatory Visit | Attending: Cardiovascular Disease | Admitting: Cardiovascular Disease

## 2020-01-25 DIAGNOSIS — Z20822 Contact with and (suspected) exposure to covid-19: Secondary | ICD-10-CM | POA: Diagnosis not present

## 2020-01-25 DIAGNOSIS — Z01812 Encounter for preprocedural laboratory examination: Secondary | ICD-10-CM | POA: Diagnosis not present

## 2020-01-25 LAB — SARS CORONAVIRUS 2 (TAT 6-24 HRS): SARS Coronavirus 2: NEGATIVE

## 2020-01-28 ENCOUNTER — Ambulatory Visit (HOSPITAL_COMMUNITY): Payer: BC Managed Care – PPO | Attending: Cardiovascular Disease

## 2020-01-28 ENCOUNTER — Other Ambulatory Visit: Payer: Self-pay

## 2020-01-28 VITALS — Ht 63.0 in | Wt 201.0 lb

## 2020-01-28 DIAGNOSIS — I4729 Other ventricular tachycardia: Secondary | ICD-10-CM

## 2020-01-28 DIAGNOSIS — I472 Ventricular tachycardia: Secondary | ICD-10-CM | POA: Diagnosis not present

## 2020-01-28 DIAGNOSIS — R42 Dizziness and giddiness: Secondary | ICD-10-CM | POA: Insufficient documentation

## 2020-01-28 DIAGNOSIS — R002 Palpitations: Secondary | ICD-10-CM | POA: Insufficient documentation

## 2020-01-28 LAB — MYOCARDIAL PERFUSION IMAGING
Estimated workload: 11.7 METS
Exercise duration (min): 10 min
Exercise duration (sec): 0 s
LV dias vol: 59 mL (ref 46–106)
LV sys vol: 22 mL
MPHR: 176 {beats}/min
Peak HR: 173 {beats}/min
Percent HR: 98 %
Rest HR: 72 {beats}/min
SDS: 0
SRS: 0
SSS: 0
TID: 0.76

## 2020-01-28 MED ORDER — TECHNETIUM TC 99M TETROFOSMIN IV KIT
32.6000 | PACK | Freq: Once | INTRAVENOUS | Status: AC | PRN
  Administered 2020-01-28: 32.6 via INTRAVENOUS
  Filled 2020-01-28: qty 33

## 2020-01-28 MED ORDER — TECHNETIUM TC 99M TETROFOSMIN IV KIT
10.2000 | PACK | Freq: Once | INTRAVENOUS | Status: AC | PRN
  Administered 2020-01-28: 10.2 via INTRAVENOUS
  Filled 2020-01-28: qty 11

## 2020-01-29 ENCOUNTER — Telehealth: Payer: Self-pay | Admitting: *Deleted

## 2020-01-29 NOTE — Telephone Encounter (Signed)
Below message received via mychart  Hi Dr Duke Salvia,   As I was preparing for my stress test, I realized that I am an a big dummy! My flavored water packets contained 120 mg per serving of caffeine (energy packets). I had no idea I was getting that much. I was trying to be good and drink water, not coke....but. I drink several of them a day (2-4).  Knowing that I am now wondering if I need to take the beta blocker or just stop drinking these and see how I do. I have already found water packets that have no caffeine or aspartame, so hopefully those will be better for me and continue to help me get the water I need. Please let me know your thoughts. Sorry to bother you. I feel like a complete idiot!   Thanks, Cynthia Cross   Will forward to Dr Duke Salvia for review

## 2020-02-03 NOTE — Telephone Encounter (Signed)
Try not taking the metoprolol.  If she still has the palpitations when she stops then she can take the metoprolol.

## 2020-02-04 NOTE — Telephone Encounter (Signed)
mychart message sent to patient

## 2020-02-20 NOTE — Telephone Encounter (Signed)
Patient responded not taking Metoprolol

## 2020-04-21 ENCOUNTER — Other Ambulatory Visit: Payer: Self-pay

## 2020-04-21 ENCOUNTER — Encounter: Payer: Self-pay | Admitting: Cardiovascular Disease

## 2020-04-21 ENCOUNTER — Ambulatory Visit: Payer: BC Managed Care – PPO | Admitting: Cardiovascular Disease

## 2020-04-21 VITALS — BP 130/80 | HR 82 | Ht 63.0 in | Wt 200.6 lb

## 2020-04-21 DIAGNOSIS — I472 Ventricular tachycardia: Secondary | ICD-10-CM | POA: Diagnosis not present

## 2020-04-21 DIAGNOSIS — I4729 Other ventricular tachycardia: Secondary | ICD-10-CM

## 2020-04-21 DIAGNOSIS — I493 Ventricular premature depolarization: Secondary | ICD-10-CM

## 2020-04-21 NOTE — Patient Instructions (Signed)
Medication Instructions:  ?Your physician recommends that you continue on your current medications as directed. Please refer to the Current Medication list given to you today.  ? ?Labwork: ?NONE ? ?Testing/Procedures: ?NONE ? ?Follow-Up: ?AS NEEDED  ? ?  ?

## 2020-04-21 NOTE — Progress Notes (Signed)
Cardiology Office Note   Date:  04/21/2020   ID:  Cynthia Cross, DOB [birth year], MRN [id number]  PCP:  Georgianne Fick, MD  Cardiologist:   Chilton Si, MD   No chief complaint on file.  History of Present Illness: Cynthia Cross is a 44 y.o. female with PACs, PVCs, and NSVT who presents for follow-up.  She was initially seen 10/2019 for an evaluation of dizziness.  Symptoms started a couple months prior.  There were a few episodes that lasted all day.  It occurs sometimes when she is sitting at her desk and seems to improve when closing her eyes.  She was tested for vertigo and symptoms were not thought to be consistent with this.  The episodes seem to happen a lot in the morning, especially when she is in the bathroom looking up and putting on make-up.  It is also happened in the shower.  She had an echo 11/2019 that revealed LVEF 55 to 60% with normal diastolic function.  It was otherwise unremarkable.  She wore a 30-day event monitor that revealed PACs, PVCs, and short runs of NSVT.  She presents today stating that she is had a couple more episodes since she took the monitor off.  She was started on metoprolol but never filled it.  She was also referred for an exercise Myoview 01/2020 that revealed LVEF 65% and no ischemia.  She achieved 11.7 METS on a Bruce protocol.  Overall she has been feeling better.  She is no longer drinking caffeine and has brief episodes approximately every couple weeks.  She has been walking for 30 minutes every day at work.  She has no exertional chest pain or shortness of breath.  She denies any lower extremity edema, orthopnea, or PND.  She had lipids with her PCP in October and they are much better than last year.  This was attributed to her increased exercise.  She has not needed to use any metoprolol.     Past Medical History:  Diagnosis Date  . Lightheadedness 11/27/2019  . Medical history non-contributory   . NSVT (nonsustained ventricular  tachycardia) (HCC) 01/23/2020  . Palpitations 11/27/2019  . PVC (premature ventricular contraction) 01/23/2020    Past Surgical History:  Procedure Laterality Date  . TONSILLECTOMY    . WISDOM TOOTH EXTRACTION       No current outpatient medications on file.   No current facility-administered medications for this visit.    Allergies:   Patient has no known allergies.    Social History:  The patient  reports that she quit smoking about 7 years ago. She has never used smokeless tobacco. She reports current alcohol use. She reports that she does not use drugs.   Family History:  The patient's family history includes Aneurysm in her father; Heart attack (age of onset: 58) in her paternal grandmother; Hypertension in her paternal uncle; Kidney Stones in her mother.    ROS:  Please see the history of present illness.   Otherwise, review of systems are positive for none.   All other systems are reviewed and negative.    PHYSICAL EXAM: VS:  BP 130/80 (BP Location: Right Arm)   Pulse 82   Ht 5\' 3"  (1.6 m)   Wt 200 lb 9.6 oz (91 kg)   BMI 35.53 kg/m  , BMI Body mass index is 35.53 kg/m. GENERAL:  Well appearing HEENT: Pupils equal round and reactive, fundi not visualized, oral mucosa unremarkable NECK:  No jugular venous distention,  waveform within normal limits, carotid upstroke brisk and symmetric, no bruits LUNGS:  Clear to auscultation bilaterally HEART:  RRR.  PMI not displaced or sustained,S1 and S2 within normal limits, no S3, no S4, no clicks, no rubs, no murmurs ABD:  Flat, positive bowel sounds normal in frequency in pitch, no bruits, no rebound, no guarding, no midline pulsatile mass, no hepatomegaly, no splenomegaly EXT:  2 plus pulses throughout, no edema, no cyanosis no clubbing SKIN:  No rashes no nodules NEURO:  Cranial nerves II through XII grossly intact, motor grossly intact throughout PSYCH:  Cognitively intact, oriented to person place and time  EKG:  EKG is not  ordered today. The ekg ordered 11/27/19 demonstrates sinus rhythm.  Rate 79 bpm.  Exercise Myoview 01/2020:  The left ventricular ejection fraction is normal (55-65%).  Nuclear stress EF: 63%.  Blood pressure demonstrated a normal response to exercise.  There was no ST segment deviation noted during stress.  The study is normal.  This is a low risk study.   Normal resting and stress perfusion. No ischemia or infarction EF 63%    30 Day Event Monitor 01/10/20:  Quality: Fair.  Baseline artifact. Predominant rhythm: sinus rhythm Average heart rate: 86 bpm Max heart rate: 157 bpm Min heart rate: 57 bpm Pauses >2.5 seconds: none  PACs and PVCs noted 4 beats NSVT   Echo 12/19/19: 1. Left ventricular ejection fraction, by estimation, is 55 to 60%. The  left ventricle has normal function. The left ventricle has no regional  wall motion abnormalities. Left ventricular diastolic parameters were  normal.  2. Right ventricular systolic function is normal. The right ventricular  size is normal. There is normal pulmonary artery systolic pressure. The  estimated right ventricular systolic pressure is 19.0 mmHg.  3. The mitral valve is normal in structure. Trivial mitral valve  regurgitation. No evidence of mitral stenosis.  4. The aortic valve is normal in structure. Aortic valve regurgitation is  not visualized. No aortic stenosis is present.  5. The inferior vena cava is normal in size with greater than 50%  respiratory variability, suggesting right atrial pressure of 3 mmHg.   Recent Labs: No results found for requested labs within last 8760 hours.    Lipid Panel No results found for: CHOL, TRIG, HDL, CHOLHDL, VLDL, LDLCALC, LDLDIRECT   36/64/4034: Total cholesterol 190, triglycerides 77, HDL 68, LDL 120 TSH 3.03  Wt Readings from Last 3 Encounters:  04/21/20 200 lb 9.6 oz (91 kg)  01/28/20 201 lb (91.2 kg)  01/23/20 201 lb (91.2 kg)      ASSESSMENT AND  PLAN:  # PACs: # PVCs: # NSVT:  Episodes were noted on the monitor and she was symptomatic.  Thyroid function, blood counts, and electrolytes are unremarkable.  Her heart is structurally normal.  Given her NSVT she was referred for an exercise Myoview that was negative for ischemia.  Her symptoms have improved with removing caffeine from her diet.  She has not needed the metoprolol.   Current medicines are reviewed at length with the patient today.  The patient does not have concerns regarding medicines.  The following changes have been made:  no change  Labs/ tests ordered today include:   No orders of the defined types were placed in this encounter.    Disposition:   FU with Shyne Resch C. Duke Salvia, MD, [facility] as needed    Signed, Tiajuana Leppanen C. Duke Salvia, MD, [facility]  04/21/2020 8:38 AM    [facility]
